# Patient Record
Sex: Male | Born: 1970 | Race: White | Hispanic: No | Marital: Married | Smoking: Never smoker
Health system: Southern US, Community
[De-identification: ages and names within clinical notes are randomized; demographics above are authoritative.]

## PROBLEM LIST (undated history)

## (undated) DIAGNOSIS — K219 Gastro-esophageal reflux disease without esophagitis: Secondary | ICD-10-CM

## (undated) DIAGNOSIS — M545 Low back pain, unspecified: Secondary | ICD-10-CM

## (undated) HISTORY — DX: Gastro-esophageal reflux disease without esophagitis: K21.9

## (undated) HISTORY — DX: Low back pain, unspecified: M54.50

---

## 2006-02-20 ENCOUNTER — Emergency Department (HOSPITAL_COMMUNITY): Admission: EM | Admit: 2006-02-20 | Discharge: 2006-02-20 | Payer: Self-pay | Admitting: Emergency Medicine

## 2007-08-13 IMAGING — CR DG NASAL BONES 3+V
3 series · 3 of 3 positions shown · non-contrast
Comparison: none

CLINICAL DATA: Blunt trauma.
NASAL BONES - 3 VIEW:

[w waters]
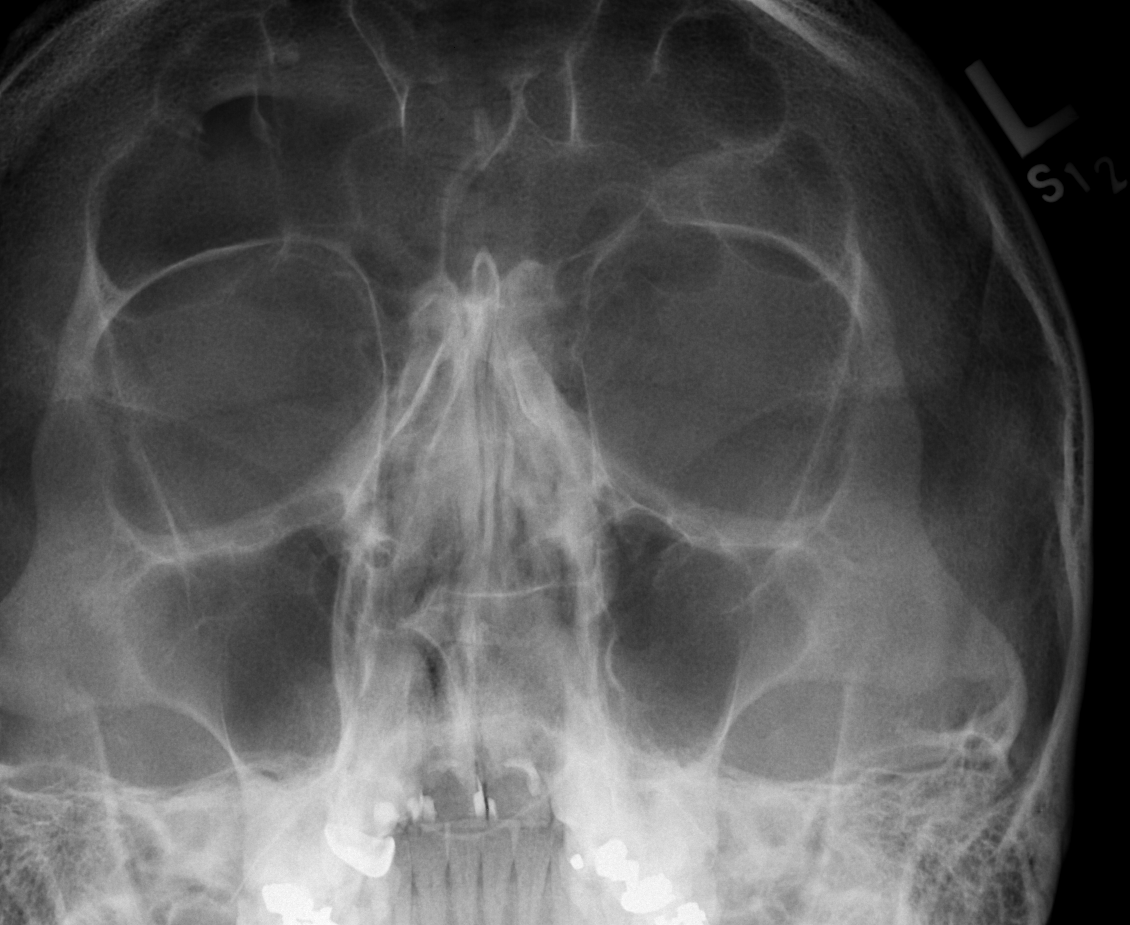

[w skull lat * (1 of 2)]
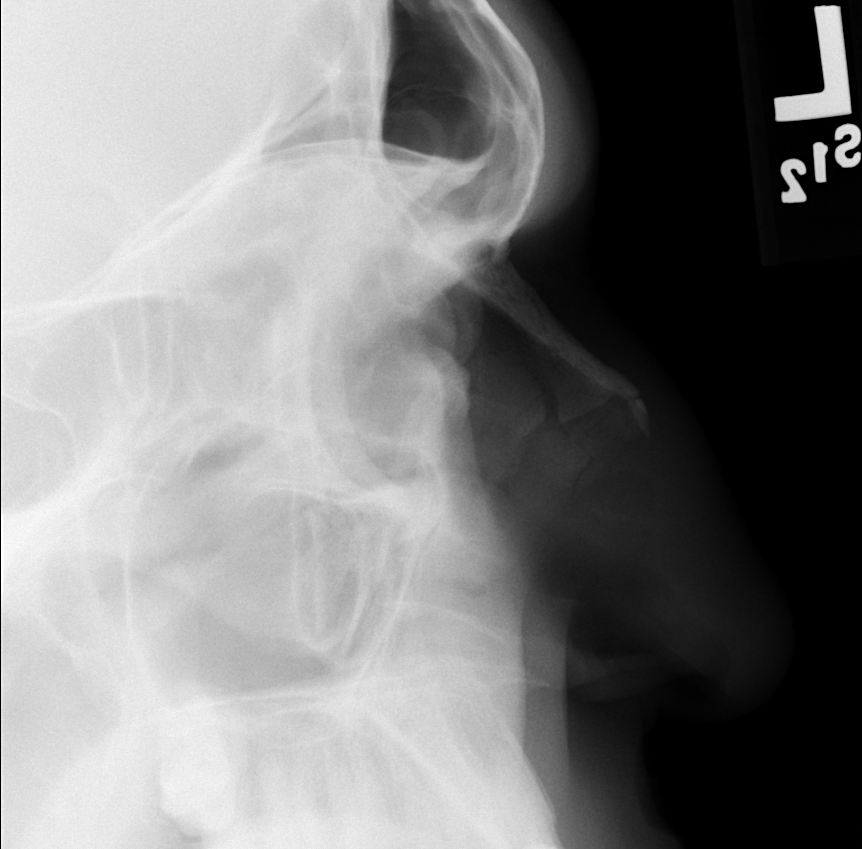

[w skull lat * (2 of 2)]
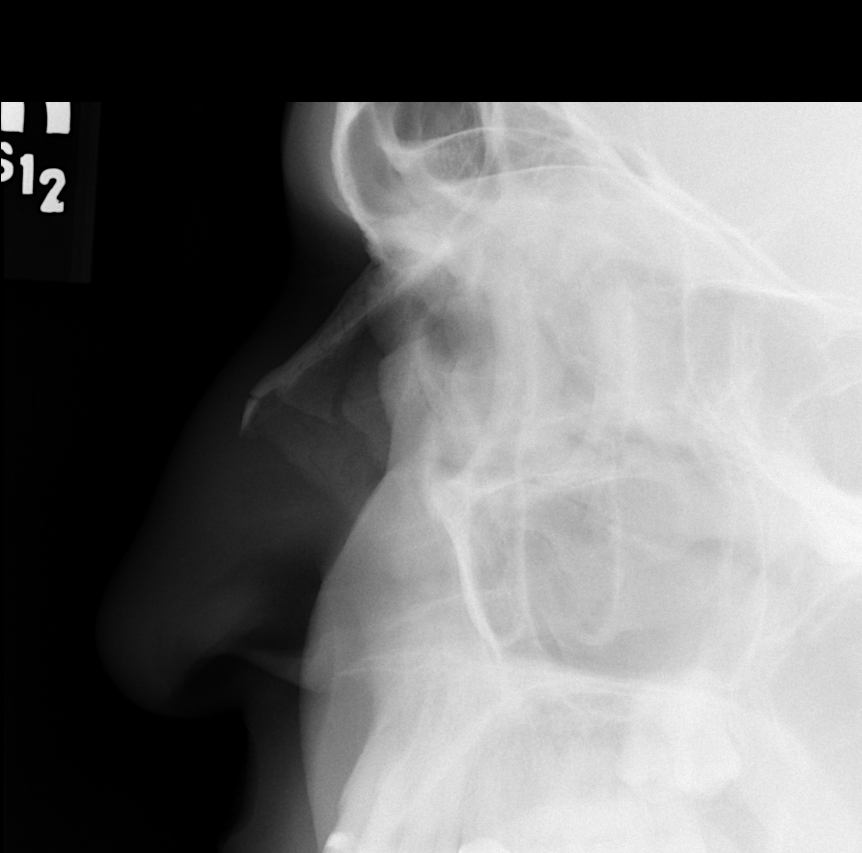

[3 of 3 positions shown; findings below may reference images not displayed]

FINDINGS: Fracture of the nasal bones with minimal displacement.
IMPRESSION: Bilateral nasal bone fracture.

## 2015-06-07 DIAGNOSIS — M65832 Other synovitis and tenosynovitis, left forearm: Secondary | ICD-10-CM | POA: Insufficient documentation

## 2018-01-11 DIAGNOSIS — M25512 Pain in left shoulder: Secondary | ICD-10-CM | POA: Diagnosis not present

## 2018-01-11 DIAGNOSIS — Z87891 Personal history of nicotine dependence: Secondary | ICD-10-CM | POA: Diagnosis not present

## 2018-01-11 DIAGNOSIS — Z6834 Body mass index (BMI) 34.0-34.9, adult: Secondary | ICD-10-CM | POA: Diagnosis not present

## 2018-01-11 DIAGNOSIS — S42022A Displaced fracture of shaft of left clavicle, initial encounter for closed fracture: Secondary | ICD-10-CM | POA: Diagnosis not present

## 2018-01-14 DIAGNOSIS — Z6834 Body mass index (BMI) 34.0-34.9, adult: Secondary | ICD-10-CM | POA: Diagnosis not present

## 2018-01-14 DIAGNOSIS — S42022A Displaced fracture of shaft of left clavicle, initial encounter for closed fracture: Secondary | ICD-10-CM | POA: Diagnosis not present

## 2018-01-14 HISTORY — DX: Displaced fracture of shaft of left clavicle, initial encounter for closed fracture: S42.022A

## 2018-01-15 DIAGNOSIS — Z8042 Family history of malignant neoplasm of prostate: Secondary | ICD-10-CM | POA: Diagnosis not present

## 2018-01-15 DIAGNOSIS — Z125 Encounter for screening for malignant neoplasm of prostate: Secondary | ICD-10-CM | POA: Diagnosis not present

## 2018-01-15 DIAGNOSIS — R5383 Other fatigue: Secondary | ICD-10-CM | POA: Diagnosis not present

## 2018-01-15 DIAGNOSIS — S42002G Fracture of unspecified part of left clavicle, subsequent encounter for fracture with delayed healing: Secondary | ICD-10-CM | POA: Diagnosis not present

## 2018-01-15 DIAGNOSIS — E782 Mixed hyperlipidemia: Secondary | ICD-10-CM | POA: Diagnosis not present

## 2018-01-15 DIAGNOSIS — Z01810 Encounter for preprocedural cardiovascular examination: Secondary | ICD-10-CM | POA: Diagnosis not present

## 2018-01-15 DIAGNOSIS — Z83438 Family history of other disorder of lipoprotein metabolism and other lipidemia: Secondary | ICD-10-CM | POA: Diagnosis not present

## 2018-01-22 DIAGNOSIS — S42022A Displaced fracture of shaft of left clavicle, initial encounter for closed fracture: Secondary | ICD-10-CM | POA: Diagnosis not present

## 2018-01-25 DIAGNOSIS — S42022A Displaced fracture of shaft of left clavicle, initial encounter for closed fracture: Secondary | ICD-10-CM | POA: Diagnosis not present

## 2018-01-30 DIAGNOSIS — S42022A Displaced fracture of shaft of left clavicle, initial encounter for closed fracture: Secondary | ICD-10-CM | POA: Diagnosis not present

## 2018-02-01 DIAGNOSIS — S42022A Displaced fracture of shaft of left clavicle, initial encounter for closed fracture: Secondary | ICD-10-CM | POA: Diagnosis not present

## 2018-02-04 DIAGNOSIS — S42022D Displaced fracture of shaft of left clavicle, subsequent encounter for fracture with routine healing: Secondary | ICD-10-CM | POA: Diagnosis not present

## 2018-02-04 DIAGNOSIS — S42022A Displaced fracture of shaft of left clavicle, initial encounter for closed fracture: Secondary | ICD-10-CM | POA: Diagnosis not present

## 2018-02-06 DIAGNOSIS — S42022A Displaced fracture of shaft of left clavicle, initial encounter for closed fracture: Secondary | ICD-10-CM | POA: Diagnosis not present

## 2018-02-08 DIAGNOSIS — S42022A Displaced fracture of shaft of left clavicle, initial encounter for closed fracture: Secondary | ICD-10-CM | POA: Diagnosis not present

## 2018-02-13 DIAGNOSIS — S42022A Displaced fracture of shaft of left clavicle, initial encounter for closed fracture: Secondary | ICD-10-CM | POA: Diagnosis not present

## 2018-02-20 DIAGNOSIS — S42022A Displaced fracture of shaft of left clavicle, initial encounter for closed fracture: Secondary | ICD-10-CM | POA: Diagnosis not present

## 2018-02-27 DIAGNOSIS — S42022A Displaced fracture of shaft of left clavicle, initial encounter for closed fracture: Secondary | ICD-10-CM | POA: Diagnosis not present

## 2018-03-04 DIAGNOSIS — S42022D Displaced fracture of shaft of left clavicle, subsequent encounter for fracture with routine healing: Secondary | ICD-10-CM | POA: Diagnosis not present

## 2018-03-04 DIAGNOSIS — S42022A Displaced fracture of shaft of left clavicle, initial encounter for closed fracture: Secondary | ICD-10-CM | POA: Diagnosis not present

## 2018-03-06 DIAGNOSIS — S42022A Displaced fracture of shaft of left clavicle, initial encounter for closed fracture: Secondary | ICD-10-CM | POA: Diagnosis not present

## 2018-04-29 DIAGNOSIS — S42022D Displaced fracture of shaft of left clavicle, subsequent encounter for fracture with routine healing: Secondary | ICD-10-CM | POA: Diagnosis not present

## 2018-04-29 DIAGNOSIS — Z6834 Body mass index (BMI) 34.0-34.9, adult: Secondary | ICD-10-CM | POA: Diagnosis not present

## 2020-02-05 DIAGNOSIS — Q5529 Other congenital malformations of testis and scrotum: Secondary | ICD-10-CM | POA: Diagnosis not present

## 2020-02-05 DIAGNOSIS — N50811 Right testicular pain: Secondary | ICD-10-CM | POA: Diagnosis not present

## 2020-02-05 DIAGNOSIS — N431 Infected hydrocele: Secondary | ICD-10-CM | POA: Diagnosis not present

## 2020-02-05 DIAGNOSIS — N451 Epididymitis: Secondary | ICD-10-CM | POA: Diagnosis not present

## 2020-02-05 DIAGNOSIS — N433 Hydrocele, unspecified: Secondary | ICD-10-CM | POA: Diagnosis not present

## 2020-02-05 DIAGNOSIS — R000 Tachycardia, unspecified: Secondary | ICD-10-CM | POA: Diagnosis not present

## 2020-02-05 DIAGNOSIS — D2932 Benign neoplasm of left epididymis: Secondary | ICD-10-CM | POA: Diagnosis not present

## 2020-02-05 DIAGNOSIS — R402412 Glasgow coma scale score 13-15, at arrival to emergency department: Secondary | ICD-10-CM | POA: Diagnosis not present

## 2020-02-05 HISTORY — DX: Epididymitis: N45.1

## 2020-02-05 HISTORY — PX: OTHER SURGICAL HISTORY: SHX169

## 2020-02-06 DIAGNOSIS — N451 Epididymitis: Secondary | ICD-10-CM | POA: Insufficient documentation

## 2020-02-06 DIAGNOSIS — K219 Gastro-esophageal reflux disease without esophagitis: Secondary | ICD-10-CM | POA: Insufficient documentation

## 2020-02-11 DIAGNOSIS — N451 Epididymitis: Secondary | ICD-10-CM | POA: Diagnosis not present

## 2020-02-24 ENCOUNTER — Ambulatory Visit (INDEPENDENT_AMBULATORY_CARE_PROVIDER_SITE_OTHER): Payer: BC Managed Care – PPO | Admitting: Legal Medicine

## 2020-02-24 ENCOUNTER — Encounter: Payer: Self-pay | Admitting: Legal Medicine

## 2020-02-24 ENCOUNTER — Other Ambulatory Visit: Payer: Self-pay

## 2020-02-24 VITALS — BP 120/80 | HR 80 | Temp 98.2°F | Resp 16 | Ht 73.0 in | Wt 266.0 lb

## 2020-02-24 DIAGNOSIS — T8189XA Other complications of procedures, not elsewhere classified, initial encounter: Secondary | ICD-10-CM | POA: Diagnosis not present

## 2020-02-24 DIAGNOSIS — N492 Inflammatory disorders of scrotum: Secondary | ICD-10-CM

## 2020-02-24 NOTE — Progress Notes (Signed)
Established Patient Office Visit  Subjective:  Patient ID: Jacob Stephens, male    DOB: Sep 11, 1970  Age: 49 y.o. MRN: [id number]  CC:  Chief Complaint  Patient presents with  . testicle surgery    Surgery on 02/05/2020 at [place]    HPI Jacob Stephens presents for patient has scrotal infection on 02/05/2020.  He did well, surgeon I & D and called in cipro for him.  He has an opening in scotum. He was first on bactrim. They have been trying to get surgeon to see the wound that is not healing. He now has cipro for 10 days. He continues to have drainage, with some fascia in wound. I have cultured.  Past Medical History:  Diagnosis Date  . Closed displaced fracture of shaft of left clavicle 01/14/2018   Formatting of this note might be different from the original. Added automatically from request for surgery [id number]  . Epididymitis, right 02/05/2020  . GERD (gastroesophageal reflux disease)   . Low back pain     Past Surgical History:  Procedure Laterality Date  . S/P surgical exploration of scrotum Right 02/05/2020    Family History  Problem Relation Age of Onset  . Diverticulitis Mother   . Thyroid disease Mother   . Prostate cancer Father   . Heart attack Father     Social History   Socioeconomic History  . Marital status: Married    Spouse name: Not on file  . Number of children: 2  . Years of education: Not on file  . Highest education level: Not on file  Occupational History  . Occupation: Truck Geophysicist/field seismologist  Tobacco Use  . Smoking status: Never Smoker  . Smokeless tobacco: Never Used  Substance and Sexual Activity  . Alcohol use: Never  . Drug use: Never  . Sexual activity: Not Currently  Other Topics Concern  . Not on file  Social History Narrative  . Not on file   Social Determinants of Health   Financial Resource Strain:   . Difficulty of Paying Living Expenses: Not on file  Food Insecurity:   . Worried About Charity fundraiser in the Last Year: Not on  file  . Ran Out of Food in the Last Year: Not on file  Transportation Needs:   . Lack of Transportation (Medical): Not on file  . Lack of Transportation (Non-Medical): Not on file  Physical Activity:   . Days of Exercise per Week: Not on file  . Minutes of Exercise per Session: Not on file  Stress:   . Feeling of Stress : Not on file  Social Connections:   . Frequency of Communication with Friends and Family: Not on file  . Frequency of Social Gatherings with Friends and Family: Not on file  . Attends Religious Services: Not on file  . Active Member of Clubs or Organizations: Not on file  . Attends Archivist Meetings: Not on file  . Marital Status: Not on file  Intimate Partner Violence:   . Fear of Current or Ex-Partner: Not on file  . Emotionally Abused: Not on file  . Physically Abused: Not on file  . Sexually Abused: Not on file    Outpatient Medications Prior to Visit  Medication Sig Dispense Refill  . ciprofloxacin (CIPRO) 500 MG tablet Take 500 mg by mouth 2 (two) times daily. (Patient not taking: Reported on 02/24/2020)     No facility-administered medications prior to visit.    Allergies  Allergen  Reactions  . Tramadol Nausea Only    ROS Review of Systems  Constitutional: Negative.   HENT: Negative.   Eyes: Negative.   Respiratory: Negative.   Cardiovascular: Negative.   Gastrointestinal: Negative.   Endocrine: Negative.   Genitourinary:       Drainage from scrotum on right  Musculoskeletal: Negative.   Skin: Negative.   Neurological: Negative.       Objective:    Physical Exam Vitals reviewed.  Constitutional:      Appearance: Normal appearance.  HENT:     Right Ear: Tympanic membrane normal.     Left Ear: Tympanic membrane normal.  Cardiovascular:     Rate and Rhythm: Normal rate and regular rhythm.     Pulses: Normal pulses.  Pulmonary:     Effort: Pulmonary effort is normal.     Breath sounds: Normal breath sounds.    Genitourinary:    Rectum: Guaiac result negative.     Comments: Open wound right scrotum 1cm- steristripped shut Musculoskeletal:        General: Normal range of motion.     Cervical back: Normal range of motion and neck supple.  Skin:    General: Skin is warm.     Capillary Refill: Capillary refill takes less than 2 seconds.  Neurological:     Mental Status: He is alert.     BP 120/80   Pulse 80   Temp 98.2 F (36.8 C)   Resp 16   Ht '6\' 1"'  (1.854 m)   Wt 266 lb (120.7 kg)   BMI 35.09 kg/m  Wt Readings from Last 3 Encounters:  02/24/20 266 lb (120.7 kg)     Health Maintenance Due  Topic Date Due  . Hepatitis C Screening  Never done  . HIV Screening  Never done  . TETANUS/TDAP  Never done    There are no preventive care reminders to display for this patient.  No results found for: TSH No results found for: WBC, HGB, HCT, MCV, PLT No results found for: NA, K, CHLORIDE, CO2, GLUCOSE, BUN, CREATININE, BILITOT, ALKPHOS, AST, ALT, PROT, ALBUMIN, CALCIUM, ANIONGAP, EGFR, GFR No results found for: CHOL No results found for: HDL No results found for: LDLCALC No results found for: TRIG No results found for: CHOLHDL No results found for: HGBA1C    Assessment & Plan:   Problem List Items Addressed This Visit      Other   Nonhealing surgical wound The area the drain was in is still wide open 1cm, he is having purulent discharge  And cipro started after cultured.  The wound was cleaned and steristripped closed .  He is to see surgeon or me in one week.  They are going out of town.    Other Visit Diagnoses    Recurrent scrotal infection    -  Primary   Relevant Orders   WOUND CULTURE Patient is being treated with ciprofloxacin, culture pendng         Follow-up: Return in about 1 week (around 03/02/2020).    Reinaldo Meeker, MD

## 2020-02-27 ENCOUNTER — Encounter: Payer: Self-pay | Admitting: Legal Medicine

## 2020-02-27 ENCOUNTER — Other Ambulatory Visit: Payer: Self-pay

## 2020-02-27 ENCOUNTER — Ambulatory Visit (INDEPENDENT_AMBULATORY_CARE_PROVIDER_SITE_OTHER): Payer: BC Managed Care – PPO | Admitting: Legal Medicine

## 2020-02-27 DIAGNOSIS — S3130XD Unspecified open wound of scrotum and testes, subsequent encounter: Secondary | ICD-10-CM | POA: Diagnosis not present

## 2020-02-27 DIAGNOSIS — S3130XA Unspecified open wound of scrotum and testes, initial encounter: Secondary | ICD-10-CM | POA: Insufficient documentation

## 2020-02-27 NOTE — Progress Notes (Signed)
Established Patient Office Visit  Subjective:  Patient ID: Jacob Stephens, male    DOB: 13-Jun-1970  Age: 49 y.o. MRN: [id number]  CC:  Chief Complaint  Patient presents with  . Wound Check    HPI Refujio Haymer presents for dehiscence of right scrotal incision.  He is here for repair since steri strips wound no hold. No infection.  Past Medical History:  Diagnosis Date  . Closed displaced fracture of shaft of left clavicle 01/14/2018   Formatting of this note might be different from the original. Added automatically from request for surgery [id number]  . Epididymitis, right 02/05/2020  . GERD (gastroesophageal reflux disease)   . Low back pain     Past Surgical History:  Procedure Laterality Date  . S/P surgical exploration of scrotum Right 02/05/2020    Family History  Problem Relation Age of Onset  . Diverticulitis Mother   . Thyroid disease Mother   . Prostate cancer Father   . Heart attack Father     Social History   Socioeconomic History  . Marital status: Married    Spouse name: Not on file  . Number of children: 2  . Years of education: Not on file  . Highest education level: Not on file  Occupational History  . Occupation: Truck Geophysicist/field seismologist  Tobacco Use  . Smoking status: Never Smoker  . Smokeless tobacco: Never Used  Substance and Sexual Activity  . Alcohol use: Never  . Drug use: Never  . Sexual activity: Not Currently  Other Topics Concern  . Not on file  Social History Narrative  . Not on file   Social Determinants of Health   Financial Resource Strain:   . Difficulty of Paying Living Expenses: Not on file  Food Insecurity:   . Worried About Charity fundraiser in the Last Year: Not on file  . Ran Out of Food in the Last Year: Not on file  Transportation Needs:   . Lack of Transportation (Medical): Not on file  . Lack of Transportation (Non-Medical): Not on file  Physical Activity:   . Days of Exercise per Week: Not on file  . Minutes of Exercise  per Session: Not on file  Stress:   . Feeling of Stress : Not on file  Social Connections:   . Frequency of Communication with Friends and Family: Not on file  . Frequency of Social Gatherings with Friends and Family: Not on file  . Attends Religious Services: Not on file  . Active Member of Clubs or Organizations: Not on file  . Attends Archivist Meetings: Not on file  . Marital Status: Not on file  Intimate Partner Violence:   . Fear of Current or Ex-Partner: Not on file  . Emotionally Abused: Not on file  . Physically Abused: Not on file  . Sexually Abused: Not on file    Outpatient Medications Prior to Visit  Medication Sig Dispense Refill  . esomeprazole (NEXIUM) 20 MG capsule Take by mouth.    . ciprofloxacin (CIPRO) 500 MG tablet Take 500 mg by mouth 2 (two) times daily. (Patient not taking: Reported on 02/24/2020)     No facility-administered medications prior to visit.    Allergies  Allergen Reactions  . Tramadol Nausea Only    ROS Review of Systems  Constitutional: Negative.   HENT: Negative.   Respiratory: Negative.   Cardiovascular: Negative.   Genitourinary: Negative.   Musculoskeletal: Negative.   Skin: Positive for wound (right scrotum).  Neurological: Negative.   Psychiatric/Behavioral: Negative.       Objective:    Physical Exam Vitals reviewed.  Constitutional:      Appearance: Normal appearance.  Cardiovascular:     Rate and Rhythm: Normal rate and regular rhythm.     Pulses: Normal pulses.     Heart sounds: Normal heart sounds.  Pulmonary:     Effort: Pulmonary effort is normal.     Breath sounds: Normal breath sounds.  Skin:    Comments: 1cm open wound right scrotum, no purulence.  The surgical incision by urology dehisced and patient was unable to get hold of the surgeon  Neurological:     Mental Status: He is alert.     BP 120/72 (BP Location: Left Arm, Patient Position: Sitting, Cuff Size: Normal)   Pulse 75   Temp  97.9 F (36.6 C) (Temporal)   Ht _0  (1.854 m)   Wt 266 lb 6.4 oz (120.8 kg)   SpO2 100%   BMI 35.15 kg/m  Wt Readings from Last 3 Encounters:  02/27/20 266 lb 6.4 oz (120.8 kg)  02/24/20 266 lb (120.7 kg)     Health Maintenance Due  Topic Date Due  . Hepatitis C Screening  Never done  . HIV Screening  Never done  . TETANUS/TDAP  Never done    There are no preventive care reminders to display for this patient.  No results found for: TSH No results found for: WBC, HGB, HCT, MCV, PLT No results found for: NA, K, CHLORIDE, CO2, GLUCOSE, BUN, CREATININE, BILITOT, ALKPHOS, AST, ALT, PROT, ALBUMIN, CALCIUM, ANIONGAP, EGFR, GFR No results found for: CHOL No results found for: HDL No results found for: LDLCALC No results found for: TRIG No results found for: CHOLHDL No results found for: HGBA1C    Assessment & Plan:   Problem List Items Addressed This Visit      Genitourinary   Open wound of scrotum    The wound was sutured closed.  Procedure: the scrotum was cleaned with betadine and prepped and draped .  1% xylocaine and epinephrine installed 3cc.  #3 0000 sutures installed.  No complications.  Follow up one week.  Routine wound care instructions    Follow-up: Return in about 1 week (around 03/05/2020) for suture removal.    Reinaldo Meeker, MD

## 2020-02-29 NOTE — Patient Instructions (Signed)
Wound Care, Adult Taking care of your wound properly can help to prevent pain, infection, and scarring. It can also help your wound to heal more quickly. How to care for your wound Wound care      Follow instructions from your health care provider about how to take care of your wound. Make sure you: ? Wash your hands with soap and water before you change the bandage (dressing). If soap and water are not available, use hand sanitizer. ? Change your dressing as told by your health care provider. ? Leave stitches (sutures), skin glue, or adhesive strips in place. These skin closures may need to stay in place for 2 weeks or longer. If adhesive strip edges start to loosen and curl up, you may trim the loose edges. Do not remove adhesive strips completely unless your health care provider tells you to do that.  Check your wound area every day for signs of infection. Check for: ? Redness, swelling, or pain. ? Fluid or blood. ? Warmth. ? Pus or a bad smell.  Ask your health care provider if you should clean the wound with mild soap and water. Doing this may include: ? Using a clean towel to pat the wound dry after cleaning it. Do not rub or scrub the wound. ? Applying a cream or ointment. Do this only as told by your health care provider. ? Covering the incision with a clean dressing.  Ask your health care provider when you can leave the wound uncovered.  Keep the dressing dry until your health care provider says it can be removed. Do not take baths, swim, use a hot tub, or do anything that would put the wound underwater until your health care provider approves. Ask your health care provider if you can take showers. You may only be allowed to take sponge baths. Medicines   If you were prescribed an antibiotic medicine, cream, or ointment, take or use the antibiotic as told by your health care provider. Do not stop taking or using the antibiotic even if your condition improves.  Take  over-the-counter and prescription medicines only as told by your health care provider. If you were prescribed pain medicine, take it 30 or more minutes before you do any wound care or as told by your health care provider. General instructions  Return to your normal activities as told by your health care provider. Ask your health care provider what activities are safe.  Do not scratch or pick at the wound.  Do not use any products that contain nicotine or tobacco, such as cigarettes and e-cigarettes. These may delay wound healing. If you need help quitting, ask your health care provider.  Keep all follow-up visits as told by your health care provider. This is important.  Eat a diet that includes protein, vitamin A, vitamin C, and other nutrient-rich foods to help the wound heal. ? Foods rich in protein include meat, dairy, beans, nuts, and other sources. ? Foods rich in vitamin A include carrots and dark green, leafy vegetables. ? Foods rich in vitamin C include citrus, tomatoes, and other fruits and vegetables. ? Nutrient-rich foods have protein, carbohydrates, fat, vitamins, or minerals. Eat a variety of healthy foods including vegetables, fruits, and whole grains. Contact a health care provider if:  You received a tetanus shot and you have swelling, severe pain, redness, or bleeding at the injection site.  Your pain is not controlled with medicine.  You have redness, swelling, or pain around the wound.    You have fluid or blood coming from the wound.  Your wound feels warm to the touch.  You have pus or a bad smell coming from the wound.  You have a fever or chills.  You are nauseous or you vomit.  You are dizzy. Get help right away if:  You have a red streak going away from your wound.  The edges of the wound open up and separate.  Your wound is bleeding, and the bleeding does not stop with gentle pressure.  You have a rash.  You faint.  You have trouble  breathing. Summary  Always wash your hands with soap and water before changing your bandage (dressing).  To help with healing, eat foods that are rich in protein, vitamin A, vitamin C, and other nutrients.  Check your wound every day for signs of infection. Contact your health care provider if you suspect that your wound is infected. This information is not intended to replace advice given to you by your health care provider. Make sure you discuss any questions you have with your health care provider. Document Revised: 08/05/2018 Document Reviewed: 11/02/2015 Elsevier Patient Education  2020 [employer]  

## 2020-03-01 ENCOUNTER — Ambulatory Visit: Payer: BC Managed Care – PPO | Admitting: Legal Medicine

## 2020-03-01 LAB — WOUND CULTURE

## 2020-03-01 NOTE — Progress Notes (Signed)
Culture- E. Coli scrotum sensitive to cipro lp

## 2020-03-05 ENCOUNTER — Ambulatory Visit: Payer: BC Managed Care – PPO | Admitting: Legal Medicine

## 2020-03-08 ENCOUNTER — Ambulatory Visit (INDEPENDENT_AMBULATORY_CARE_PROVIDER_SITE_OTHER): Payer: BC Managed Care – PPO | Admitting: Legal Medicine

## 2020-03-08 ENCOUNTER — Encounter: Payer: Self-pay | Admitting: Legal Medicine

## 2020-03-08 ENCOUNTER — Other Ambulatory Visit: Payer: Self-pay

## 2020-03-08 VITALS — BP 110/70 | HR 77 | Temp 98.0°F | Resp 16 | Ht 73.0 in | Wt 272.0 lb

## 2020-03-08 DIAGNOSIS — S3130XD Unspecified open wound of scrotum and testes, subsequent encounter: Secondary | ICD-10-CM | POA: Diagnosis not present

## 2020-03-08 NOTE — Progress Notes (Signed)
Subjective:  Patient ID: Jacob Stephens, male    DOB: 12/28/1970  Age: 49 y.o. MRN: [id number]  Chief Complaint  Patient presents with  . Wound Check    stitches removed    HPI: the scrotum is still red and draining. , the sutures are intact.  He is still having some drainage from the open wound.  He is off his antibiotics.  Sutures remain.  Recultured the drainage since he is continued to have some infection and this open wound will not heal until infection has resolved.  Patient and his wife do understand this.   Current Outpatient Medications on File Prior to Visit  Medication Sig Dispense Refill  . esomeprazole (NEXIUM) 20 MG capsule Take by mouth.     No current facility-administered medications on file prior to visit.   Past Medical History:  Diagnosis Date  . Closed displaced fracture of shaft of left clavicle 01/14/2018   Formatting of this note might be different from the original. Added automatically from request for surgery [id number]  . Epididymitis, right 02/05/2020  . GERD (gastroesophageal reflux disease)   . Low back pain    Past Surgical History:  Procedure Laterality Date  . S/P surgical exploration of scrotum Right 02/05/2020    Family History  Problem Relation Age of Onset  . Diverticulitis Mother   . Thyroid disease Mother   . Prostate cancer Father   . Heart attack Father    Social History   Socioeconomic History  . Marital status: Married    Spouse name: Not on file  . Number of children: 2  . Years of education: Not on file  . Highest education level: Not on file  Occupational History  . Occupation: Truck Hospital doctor  Tobacco Use  . Smoking status: Never Smoker  . Smokeless tobacco: Never Used  Substance and Sexual Activity  . Alcohol use: Never  . Drug use: Never  . Sexual activity: Not Currently  Other Topics Concern  . Not on file  Social History Narrative  . Not on file   Social Determinants of Health   Financial Resource Strain:   .  Difficulty of Paying Living Expenses: Not on file  Food Insecurity:   . Worried About Programme researcher, broadcasting/film/video in the Last Year: Not on file  . Ran Out of Food in the Last Year: Not on file  Transportation Needs:   . Lack of Transportation (Medical): Not on file  . Lack of Transportation (Non-Medical): Not on file  Physical Activity:   . Days of Exercise per Week: Not on file  . Minutes of Exercise per Session: Not on file  Stress:   . Feeling of Stress : Not on file  Social Connections:   . Frequency of Communication with Friends and Family: Not on file  . Frequency of Social Gatherings with Friends and Family: Not on file  . Attends Religious Services: Not on file  . Active Member of Clubs or Organizations: Not on file  . Attends Banker Meetings: Not on file  . Marital Status: Not on file    Review of Systems  Constitutional: Negative.   HENT: Negative.   Eyes: Negative.   Respiratory: Negative.   Cardiovascular: Negative.   Gastrointestinal: Negative.   Genitourinary: Positive for scrotal swelling.  Musculoskeletal: Negative.   Neurological: Negative.   Psychiatric/Behavioral: Negative.      Objective:  BP 110/70   Pulse 77   Temp 98 F (36.7 C)  Resp 16   Ht 6\' 1"  (1.854 m)   Wt 272 lb (123.4 kg)   SpO2 99%   BMI 35.89 kg/m   BP/Weight 03/08/2020 02/27/2020 02/24/2020  Systolic BP 110 120 120  Diastolic BP 70 72 80  Wt. (Lbs) 272 266.4 266  BMI 35.89 35.15 35.09    Physical Exam Vitals reviewed.  Constitutional:      Appearance: Normal appearance.  HENT:     Head: Normocephalic and atraumatic.     Right Ear: Tympanic membrane normal.     Left Ear: Tympanic membrane normal.  Cardiovascular:     Rate and Rhythm: Normal rate and regular rhythm.     Pulses: Normal pulses.     Heart sounds: Normal heart sounds.  Pulmonary:     Effort: Pulmonary effort is normal.     Breath sounds: Normal breath sounds.  Genitourinary:    Comments: Scrotum  still draining but sutures intact.  Re-culture drainage before new antibiotics. Neurological:     Mental Status: He is alert.       No results found for: WBC, HGB, HCT, PLT, GLUCOSE, CHOL, TRIG, HDL, LDLDIRECT, LDLCALC, ALT, AST, NA, K, CL, CREATININE, BUN, CO2, TSH, PSA, INR, GLUF, HGBA1C, MICROALBUR    Assessment & Plan:   1. Open wound of scrotum, subsequent encounter - Anaerobic and Aerobic Culture Patient is having some improvement of the scrotal open wound but it continues to drain and is somewhat red.  It is nontender.  Area was cultured and sent off before we start any new antibiotics.  We discussed the need to use you but was sterilized the area for the open wound to actually heal.  We will follow back in 2 weeks otherwise.       Follow-up: Return in about 2 weeks (around 03/22/2020) for infection.  An After Visit Summary was printed and given to the patient.  03/24/2020 [facility] [phone]

## 2020-03-12 ENCOUNTER — Other Ambulatory Visit: Payer: Self-pay

## 2020-03-12 LAB — ANAEROBIC AND AEROBIC CULTURE

## 2020-03-12 MED ORDER — CEPHALEXIN 500 MG PO CAPS: 500.0000 mg | ORAL_CAPSULE | Freq: Two times a day (BID) | ORAL | 0 refills | Status: DC

## 2020-03-14 NOTE — Progress Notes (Signed)
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 lp

## 2020-03-19 ENCOUNTER — Other Ambulatory Visit: Payer: Self-pay

## 2020-03-19 ENCOUNTER — Ambulatory Visit (INDEPENDENT_AMBULATORY_CARE_PROVIDER_SITE_OTHER): Payer: BC Managed Care – PPO | Admitting: Legal Medicine

## 2020-03-19 ENCOUNTER — Encounter: Payer: Self-pay | Admitting: Legal Medicine

## 2020-03-19 VITALS — BP 120/80 | HR 77 | Temp 97.7°F | Resp 16 | Ht 73.0 in | Wt 266.0 lb

## 2020-03-19 DIAGNOSIS — S3130XD Unspecified open wound of scrotum and testes, subsequent encounter: Secondary | ICD-10-CM

## 2020-03-19 NOTE — Progress Notes (Signed)
Acute Office Visit  Subjective:    Patient ID: Jacob Stephens, male    DOB: March 02, 1971, 49 y.o.   MRN: [id number]  Chief Complaint  Patient presents with  . Open Wound    HPI Patient is in today for follow up of scrotum open wound.  He was sutured and now the scrotal incision is well healed.  He is still getting some purulent drainage from drainage site.  He continues on antibiotics, we will see in one month.  He was followed up 03/19/2020 for suture removal.  The wound has closed well  Past Medical History:  Diagnosis Date  . Closed displaced fracture of shaft of left clavicle 01/14/2018   Formatting of this note might be different from the original. Added automatically from request for surgery [id number]  . Epididymitis, right 02/05/2020  . GERD (gastroesophageal reflux disease)   . Low back pain     Past Surgical History:  Procedure Laterality Date  . S/P surgical exploration of scrotum Right 02/05/2020    Family History  Problem Relation Age of Onset  . Diverticulitis Mother   . Thyroid disease Mother   . Prostate cancer Father   . Heart attack Father     Social History   Socioeconomic History  . Marital status: Married    Spouse name: Not on file  . Number of children: 2  . Years of education: Not on file  . Highest education level: Not on file  Occupational History  . Occupation: Truck Geophysicist/field seismologist  Tobacco Use  . Smoking status: Never Smoker  . Smokeless tobacco: Never Used  Substance and Sexual Activity  . Alcohol use: Never  . Drug use: Never  . Sexual activity: Not Currently  Other Topics Concern  . Not on file  Social History Narrative  . Not on file   Social Determinants of Health   Financial Resource Strain:   . Difficulty of Paying Living Expenses: Not on file  Food Insecurity:   . Worried About Charity fundraiser in the Last Year: Not on file  . Ran Out of Food in the Last Year: Not on file  Transportation Needs:   . Lack of Transportation  (Medical): Not on file  . Lack of Transportation (Non-Medical): Not on file  Physical Activity:   . Days of Exercise per Week: Not on file  . Minutes of Exercise per Session: Not on file  Stress:   . Feeling of Stress : Not on file  Social Connections:   . Frequency of Communication with Friends and Family: Not on file  . Frequency of Social Gatherings with Friends and Family: Not on file  . Attends Religious Services: Not on file  . Active Member of Clubs or Organizations: Not on file  . Attends Archivist Meetings: Not on file  . Marital Status: Not on file  Intimate Partner Violence:   . Fear of Current or Ex-Partner: Not on file  . Emotionally Abused: Not on file  . Physically Abused: Not on file  . Sexually Abused: Not on file    Outpatient Medications Prior to Visit  Medication Sig Dispense Refill  . cephALEXin (KEFLEX) 500 MG capsule Take 1 capsule (500 mg total) by mouth in the morning and at bedtime. 20 capsule 0  . esomeprazole (NEXIUM) 20 MG capsule Take by mouth.     No facility-administered medications prior to visit.    Allergies  Allergen Reactions  . Tramadol Nausea Only  Review of Systems  Constitutional: Negative for activity change and appetite change.  HENT: Negative.  Negative for congestion.   Eyes: Negative.   Respiratory: Negative for apnea, cough, choking and chest tightness.   Cardiovascular: Positive for leg swelling. Negative for chest pain.  Endocrine: Negative.   Genitourinary: Negative for difficulty urinating and dysuria.  Skin:       incison site well healed  Neurological: Negative.   Psychiatric/Behavioral: Negative.        Objective:    Physical Exam Vitals reviewed.  Constitutional:      Appearance: Normal appearance.  Cardiovascular:     Rate and Rhythm: Normal rate and regular rhythm.     Pulses: Normal pulses.     Heart sounds: Normal heart sounds.  Pulmonary:     Effort: Pulmonary effort is normal.   Skin:    Comments: incision site on right side of scrotum, sutures removed. Well healed.  There is some drainage from the drain tube site. He will remain on antibiotics.  Neurological:     General: No focal deficit present.     Mental Status: He is alert.     BP 120/80   Pulse 77   Temp 97.7 F (36.5 C)   Resp 16   Ht _0  (1.854 m)   Wt 266 lb (120.7 kg)   SpO2 96%   BMI 35.09 kg/m  Wt Readings from Last 3 Encounters:  03/19/20 266 lb (120.7 kg)  03/08/20 272 lb (123.4 kg)  02/27/20 266 lb 6.4 oz (120.8 kg)    Health Maintenance Due  Topic Date Due  . Hepatitis C Screening  Never done  . HIV Screening  Never done  . TETANUS/TDAP  Never done    There are no preventive care reminders to display for this patient.   No results found for: TSH No results found for: WBC, HGB, HCT, MCV, PLT No results found for: NA, K, CHLORIDE, CO2, GLUCOSE, BUN, CREATININE, BILITOT, ALKPHOS, AST, ALT, PROT, ALBUMIN, CALCIUM, ANIONGAP, EGFR, GFR No results found for: CHOL No results found for: HDL No results found for: LDLCALC No results found for: TRIG No results found for: CHOLHDL No results found for: HGBA1C     Assessment & Plan:  1. Open wound of scrotum, subsequent encounter Sutures were removed and wound well healed.  He still has some draininga from a second drain hole.  Continue antibiotics.  Follow up if this does not clear or see his urologist         I  My nursing staff have aided in the documentation of this note on the behalf of Reinaldo Meeker, MD,as directed by  Reinaldo Meeker, MD and thoroughly reviewed by Reinaldo Meeker, MD.  Follow-up: Return if symptoms worsen or fail to improve.  An After Visit Summary was printed and given to the patient.  Reinaldo Meeker, MD [facility] [phone]

## 2020-07-28 ENCOUNTER — Ambulatory Visit: Payer: Self-pay | Admitting: Physician Assistant

## 2020-07-28 ENCOUNTER — Other Ambulatory Visit: Payer: Self-pay

## 2021-06-22 ENCOUNTER — Other Ambulatory Visit: Payer: Self-pay

## 2021-06-22 ENCOUNTER — Ambulatory Visit: Payer: BC Managed Care – PPO | Admitting: Physician Assistant

## 2021-06-22 ENCOUNTER — Encounter: Payer: Self-pay | Admitting: Physician Assistant

## 2021-06-22 ENCOUNTER — Ambulatory Visit (INDEPENDENT_AMBULATORY_CARE_PROVIDER_SITE_OTHER): Payer: BC Managed Care – PPO

## 2021-06-22 VITALS — BP 122/78 | HR 87 | Temp 97.5°F | Ht 73.5 in | Wt 274.2 lb

## 2021-06-22 DIAGNOSIS — E669 Obesity, unspecified: Secondary | ICD-10-CM

## 2021-06-22 DIAGNOSIS — R5381 Other malaise: Secondary | ICD-10-CM

## 2021-06-22 DIAGNOSIS — E7801 Familial hypercholesterolemia: Secondary | ICD-10-CM | POA: Diagnosis not present

## 2021-06-22 DIAGNOSIS — Z23 Encounter for immunization: Secondary | ICD-10-CM | POA: Diagnosis not present

## 2021-06-22 DIAGNOSIS — R635 Abnormal weight gain: Secondary | ICD-10-CM

## 2021-06-22 DIAGNOSIS — Z125 Encounter for screening for malignant neoplasm of prostate: Secondary | ICD-10-CM | POA: Diagnosis not present

## 2021-06-22 MED ORDER — WEGOVY 0.25 MG/0.5ML ~~LOC~~ SOAJ: 0.2500 mg | SUBCUTANEOUS | 0 refills | Status: DC

## 2021-06-22 NOTE — Progress Notes (Signed)
Subjective:  Patient ID: Jacob Stephens, male    DOB: 07-12-1970  Age: 51 y.o. MRN: [id number]  Chief Complaint  Patient presents with   Weight Gain    HPI  Pt complains of weight gain.  Has been watching diet and trying to exercise  -would like to try medication to help with weight loss  Pt would like labwork done as well because of weight gain and general malaise.  He is due for PSA screening as well  Pt would like to update tetanus booster No current outpatient medications on file prior to visit.   No current facility-administered medications on file prior to visit.   Past Medical History:  Diagnosis Date   Closed displaced fracture of shaft of left clavicle 01/14/2018   Formatting of this note might be different from the original. Added automatically from request for surgery [id number]   Epididymitis, right 02/05/2020   GERD (gastroesophageal reflux disease)    Low back pain    Past Surgical History:  Procedure Laterality Date   S/P surgical exploration of scrotum Right 02/05/2020    Family History  Problem Relation Age of Onset   Diverticulitis Mother    Thyroid disease Mother    Prostate cancer Father    Heart attack Father    Social History   Socioeconomic History   Marital status: Married    Spouse name: Not on file   Number of children: 2   Years of education: Not on file   Highest education level: Not on file  Occupational History   Occupation: Truck [employer] doctor  Tobacco Use   Smoking status: Never   Smokeless tobacco: Never  Substance and Sexual Activity   Alcohol use: Never   Drug use: Never   Sexual activity: Not Currently  Other Topics Concern   Not on file  Social History Narrative   Not on file   Social Determinants of Health   Financial Resource Strain: Not on file  Food Insecurity: Not on file  Transportation Needs: Not on file  Physical Activity: Not on file  Stress: Not on file  Social Connections: Not on file    Review of  Systems CONSTITUTIONAL: see HPI E/N/T: Negative for ear pain, nasal congestion and sore throat.  CARDIOVASCULAR: Negative for chest pain, dizziness, palpitations and pedal edema.  RESPIRATORY: Negative for recent cough and dyspnea.  GASTROINTESTINAL: Negative for abdominal pain, acid reflux symptoms, constipation, diarrhea, nausea and vomiting.  PSYCHIATRIC: Negative for sleep disturbance and to question depression screen.  Negative for depression, negative for anhedonia.       Objective:  BP 122/78 (BP Location: Right Arm, Patient Position: Sitting, Cuff Size: Large)    Pulse 87    Temp (!) 97.5 F (36.4 C) (Temporal)    Ht 6' 1.5" (1.867 m)    Wt 274 lb 3.2 oz (124.4 kg)    SpO2 97%    BMI 35.69 kg/m   BP/Weight 06/22/2021 03/19/2020 03/08/2020  Systolic BP 122 120 110  Diastolic BP 78 80 70  Wt. (Lbs) 274.2 266 272  BMI 35.69 35.09 35.89    Physical Exam PHYSICAL EXAM:   VS: BP 122/78 (BP Location: Right Arm, Patient Position: Sitting, Cuff Size: Large)    Pulse 87    Temp (!) 97.5 F (36.4 C) (Temporal)    Ht 6' 1.5" (1.867 m)    Wt 274 lb 3.2 oz (124.4 kg)    SpO2 97%    BMI 35.69 kg/m   GEN: Well  nourished, well developed, in no acute distress  Cardiac: RRR; no murmurs, Respiratory:  normal respiratory rate and pattern with no distress - normal breath sounds with no rales, rhonchi, wheezes or rubs  Psych: euthymic mood, appropriate affect and demeanor  Diabetic Foot Exam - Simple   No data filed      No results found for: WBC, HGB, HCT, PLT, GLUCOSE, CHOL, TRIG, HDL, LDLDIRECT, LDLCALC, ALT, AST, NA, K, CL, CREATININE, BUN, CO2, TSH, PSA, INR, GLUF, HGBA1C, MICROALBUR    Assessment & Plan:   Problem List Items Addressed This Visit   None Visit Diagnoses     Malaise    -  Primary   Relevant Orders   CBC with Differential/Platelet   Comprehensive metabolic panel   TSH   Weight gain       Relevant Orders   TSH   Prostate cancer screening       Relevant  Orders   PSA   Familial hypercholesterolemia       Relevant Orders   Lipid panel   Need for diphtheria-tetanus-pertussis (Tdap) vaccine       Relevant Orders   Tdap vaccine greater than or equal to 7yo IM (Completed)   Obesity (BMI 35.0-39.9 without comorbidity)       Relevant Medications   Semaglutide-Weight Management (WEGOVY) 0.25 MG/0.5ML SOAJ     .  Meds ordered this encounter  Medications   Semaglutide-Weight Management (WEGOVY) 0.25 MG/0.5ML SOAJ    Sig: Inject 0.25 mg into the skin once a week.    Dispense:  2 mL    Refill:  0    Order Specific Question:   Supervising Provider    AnswerCorey Harold    Orders Placed This Encounter  Procedures   Tdap vaccine greater than or equal to 7yo IM   CBC with Differential/Platelet   Comprehensive metabolic panel   TSH   Lipid panel   PSA     Follow-up: Return in about 6 weeks (around 08/03/2021) for follow up.al  An After Visit Summary was printed and given to the patient.  Jettie Pagan [facility] [phone]

## 2021-06-23 LAB — LIPID PANEL
Chol/HDL Ratio: 5.7 ratio — ABNORMAL HIGH (ref 0.0–5.0)
Cholesterol, Total: 232 mg/dL — ABNORMAL HIGH (ref 100–199)
HDL: 41 mg/dL (ref >39)
LDL Chol Calc (NIH): 169 mg/dL — ABNORMAL HIGH (ref 0–99)
Triglycerides: 119 mg/dL (ref 0–149)
VLDL Cholesterol Cal: 22 mg/dL (ref 5–40)

## 2021-06-23 LAB — COMPREHENSIVE METABOLIC PANEL
ALT: 46 IU/L — ABNORMAL HIGH (ref 0–44)
AST: 28 IU/L (ref 0–40)
Albumin/Globulin Ratio: 1.6 (ref 1.2–2.2)
Albumin: 4.7 g/dL (ref 4.0–5.0)
Alkaline Phosphatase: 82 IU/L (ref 44–121)
BUN/Creatinine Ratio: 19 (ref 9–20)
BUN: 20 mg/dL (ref 6–24)
Bilirubin Total: 0.4 mg/dL (ref 0.0–1.2)
CO2: 23 mmol/L (ref 20–29)
Calcium: 10 mg/dL (ref 8.7–10.2)
Chloride: 103 mmol/L (ref 96–106)
Creatinine, Ser: 1.06 mg/dL (ref 0.76–1.27)
Globulin, Total: 3 g/dL (ref 1.5–4.5)
Glucose: 82 mg/dL (ref 70–99)
Potassium: 4.5 mmol/L (ref 3.5–5.2)
Sodium: 139 mmol/L (ref 134–144)
Total Protein: 7.7 g/dL (ref 6.0–8.5)
eGFR: 85 mL/min/{1.73_m2} (ref >59)

## 2021-06-23 LAB — TSH: TSH: 5.22 u[IU]/mL — ABNORMAL HIGH (ref 0.450–4.500)

## 2021-06-23 LAB — CBC WITH DIFFERENTIAL/PLATELET
Basophils Absolute: 0 10*3/uL (ref 0.0–0.2)
Basos: 1 %
EOS (ABSOLUTE): 0.3 10*3/uL (ref 0.0–0.4)
Eos: 4 %
Hematocrit: 40.2 % (ref 37.5–51.0)
Hemoglobin: 13.6 g/dL (ref 13.0–17.7)
Immature Grans (Abs): 0 10*3/uL (ref 0.0–0.1)
Immature Granulocytes: 0 %
Lymphocytes Absolute: 2.2 10*3/uL (ref 0.7–3.1)
Lymphs: 30 %
MCH: 29 pg (ref 26.6–33.0)
MCHC: 33.8 g/dL (ref 31.5–35.7)
MCV: 86 fL (ref 79–97)
Monocytes Absolute: 0.7 10*3/uL (ref 0.1–0.9)
Monocytes: 10 %
Neutrophils Absolute: 4.1 10*3/uL (ref 1.4–7.0)
Neutrophils: 55 %
Platelets: 304 10*3/uL (ref 150–450)
RBC: 4.69 x10E6/uL (ref 4.14–5.80)
RDW: 12.9 % (ref 11.6–15.4)
WBC: 7.4 10*3/uL (ref 3.4–10.8)

## 2021-06-23 LAB — CARDIOVASCULAR RISK ASSESSMENT

## 2021-06-23 LAB — PSA: Prostate Specific Ag, Serum: 0.6 ng/mL (ref 0.0–4.0)

## 2021-06-24 ENCOUNTER — Other Ambulatory Visit: Payer: Self-pay | Admitting: Physician Assistant

## 2021-06-24 DIAGNOSIS — E782 Mixed hyperlipidemia: Secondary | ICD-10-CM

## 2021-06-24 MED ORDER — ROSUVASTATIN CALCIUM 5 MG PO TABS: 5.0000 mg | ORAL_TABLET | Freq: Every day | ORAL | 0 refills | Status: DC

## 2021-07-06 ENCOUNTER — Other Ambulatory Visit: Payer: Self-pay | Admitting: Physician Assistant

## 2021-07-06 DIAGNOSIS — R635 Abnormal weight gain: Secondary | ICD-10-CM

## 2021-07-06 MED ORDER — PHENTERMINE HCL 37.5 MG PO CAPS: 37.5000 mg | ORAL_CAPSULE | ORAL | 0 refills | Status: DC

## 2021-08-02 ENCOUNTER — Ambulatory Visit: Payer: BC Managed Care – PPO | Admitting: Physician Assistant

## 2021-08-03 ENCOUNTER — Ambulatory Visit: Payer: BC Managed Care – PPO | Admitting: Physician Assistant

## 2021-08-03 ENCOUNTER — Encounter: Payer: Self-pay | Admitting: Physician Assistant

## 2021-08-03 VITALS — BP 118/72 | HR 73 | Resp 16 | Ht 73.5 in | Wt 262.6 lb

## 2021-08-03 DIAGNOSIS — R899 Unspecified abnormal finding in specimens from other organs, systems and tissues: Secondary | ICD-10-CM | POA: Diagnosis not present

## 2021-08-03 DIAGNOSIS — R635 Abnormal weight gain: Secondary | ICD-10-CM | POA: Diagnosis not present

## 2021-08-03 MED ORDER — PHENTERMINE HCL 37.5 MG PO CAPS: 37.5000 mg | ORAL_CAPSULE | ORAL | 1 refills | Status: DC

## 2021-08-03 NOTE — Progress Notes (Signed)
? ?Subjective:  ?Patient ID: Jacob Stephens, male    DOB: Nov 19, 1970  Age: 51 y.o. MRN: [id number] ? ?Chief Complaint  ?Patient presents with  ? Weight Check  ? ? ?HPI ? Pt with history of slightly elevated TSH - is due to recheck - currently not on medication ? ?Pt with history of obesity - he was placed on adipex this past month and has done really well with medication - he has lost 12 pounds since last visit ?Current Outpatient Medications on File Prior to Visit  ?Medication Sig Dispense Refill  ? rosuvastatin (CRESTOR) 5 MG tablet Take 1 tablet (5 mg total) by mouth daily. 90 tablet 0  ? ?No current facility-administered medications on file prior to visit.  ? ?Past Medical History:  ?Diagnosis Date  ? Closed displaced fracture of shaft of left clavicle 01/14/2018  ? Formatting of this note might be different from the original. Added automatically from request for surgery [id number]  ? Epididymitis, right 02/05/2020  ? GERD (gastroesophageal reflux disease)   ? Low back pain   ? ?Past Surgical History:  ?Procedure Laterality Date  ? S/P surgical exploration of scrotum Right 02/05/2020  ?  ?Family History  ?Problem Relation Age of Onset  ? Diverticulitis Mother   ? Thyroid disease Mother   ? Prostate cancer Father   ? Heart attack Father   ? ?Social History  ? ?Socioeconomic History  ? Marital status: Married  ?  Spouse name: Not on file  ? Number of children: 2  ? Years of education: Not on file  ? Highest education level: Not on file  ?Occupational History  ? Occupation: Naval architect  ?Tobacco Use  ? Smoking status: Never  ? Smokeless tobacco: Never  ?Substance and Sexual Activity  ? Alcohol use: Never  ? Drug use: Never  ? Sexual activity: Not Currently  ?Other Topics Concern  ? Not on file  ?Social History Narrative  ? Not on file  ? ?Social Determinants of Health  ? ?Financial Resource Strain: Not on file  ?Food Insecurity: Not on file  ?Transportation Needs: Not on file  ?Physical Activity: Not on file  ?Stress:  Not on file  ?Social Connections: Not on file  ? ? ?Review of Systems ?CONSTITUTIONAL: Negative for chills, fatigue, fever, unintentional weight gain and unintentional weight loss.  ? ?CARDIOVASCULAR: Negative for chest pain, dizziness, palpitations and pedal edema.  ?RESPIRATORY: Negative for recent cough and dyspnea.  ?GASTROINTESTINAL: Negative for abdominal pain, acid reflux symptoms, constipation, diarrhea, nausea and vomiting.  ? ? ? ?Objective:  ?BP 118/72   Pulse 73   Resp 16   Ht 6' 1.5" (1.867 m)   Wt 262 lb 9.6 oz (119.1 kg)   SpO2 97%   BMI 34.18 kg/m?  ? ? ?  08/03/2021  ?  3:32 PM 06/22/2021  ?  3:36 PM 03/19/2020  ? 11:28 AM  ?BP/Weight  ?Systolic BP 118 122 120  ?Diastolic BP 72 78 80  ?Wt. (Lbs) 262.6 274.2 266  ?BMI 34.18 kg/m2 35.69 kg/m2 35.09 kg/m2  ? ? ?Physical Exam ?PHYSICAL EXAM:  ? ?VS: BP 118/72   Pulse 73   Resp 16   Ht 6' 1.5" (1.867 m)   Wt 262 lb 9.6 oz (119.1 kg)   SpO2 97%   BMI 34.18 kg/m?  ? ?GEN: Well nourished, well developed, in no acute distress  ?Cardiac: RRR; no murmurs, rubs ?Respiratory:  normal respiratory rate and pattern with no distress - normal breath  sounds with no rales, rhonchi, wheezes or rubs ?Psych: euthymic mood, appropriate affect and demeanor ? ?Diabetic Foot Exam - Simple   ?No data filed ?  ?  ? ?Lab Results  ?Component Value Date  ? WBC 7.4 06/22/2021  ? HGB 13.6 06/22/2021  ? HCT 40.2 06/22/2021  ? PLT 304 06/22/2021  ? GLUCOSE 82 06/22/2021  ? CHOL 232 (H) 06/22/2021  ? TRIG 119 06/22/2021  ? HDL 41 06/22/2021  ? LDLCALC 169 (H) 06/22/2021  ? ALT 46 (H) 06/22/2021  ? AST 28 06/22/2021  ? NA 139 06/22/2021  ? K 4.5 06/22/2021  ? CL 103 06/22/2021  ? CREATININE 1.06 06/22/2021  ? BUN 20 06/22/2021  ? CO2 23 06/22/2021  ? TSH 5.220 (H) 06/22/2021  ? ? ? ? ?Assessment & Plan:  ? ?Problem List Items Addressed This Visit   ?None ?Visit Diagnoses   ? ? Abnormal laboratory test    -  Primary  ? Relevant Orders  ? TSH  ? Weight gain      ? Relevant  Medications  ? phentermine 37.5 MG capsule  ? ?  ?. ? ?Meds ordered this encounter  ?Medications  ? phentermine 37.5 MG capsule  ?  Sig: Take 1 capsule (37.5 mg total) by mouth every morning.  ?  Dispense:  30 capsule  ?  Refill:  1  ?  Order Specific Question:   Supervising Provider  ?  AnswerBlane Ohara [[id number]]  ? ? ?Orders Placed This Encounter  ?Procedures  ? TSH  ?  ? ?Follow-up: Return for cpe on 6/20. ? ?An After Visit Summary was printed and given to the patient. ? ?SARA R Gurley Climer, PA-C ?[facility] ?[phone]

## 2021-08-04 LAB — TSH: TSH: 4.16 u[IU]/mL (ref 0.450–4.500)

## 2021-09-01 ENCOUNTER — Other Ambulatory Visit: Payer: Self-pay | Admitting: Physician Assistant

## 2021-09-01 DIAGNOSIS — E782 Mixed hyperlipidemia: Secondary | ICD-10-CM

## 2021-10-06 ENCOUNTER — Other Ambulatory Visit: Payer: Self-pay | Admitting: Physician Assistant

## 2021-10-06 DIAGNOSIS — R635 Abnormal weight gain: Secondary | ICD-10-CM

## 2021-10-10 ENCOUNTER — Other Ambulatory Visit: Payer: Self-pay

## 2021-10-10 DIAGNOSIS — E782 Mixed hyperlipidemia: Secondary | ICD-10-CM

## 2021-10-10 DIAGNOSIS — K219 Gastro-esophageal reflux disease without esophagitis: Secondary | ICD-10-CM

## 2021-10-11 ENCOUNTER — Other Ambulatory Visit: Payer: Self-pay

## 2021-10-11 ENCOUNTER — Other Ambulatory Visit: Payer: BC Managed Care – PPO

## 2021-10-11 DIAGNOSIS — K219 Gastro-esophageal reflux disease without esophagitis: Secondary | ICD-10-CM

## 2021-10-11 DIAGNOSIS — E782 Mixed hyperlipidemia: Secondary | ICD-10-CM

## 2021-10-12 LAB — CBC WITH DIFFERENTIAL/PLATELET
Basophils Absolute: 0 10*3/uL (ref 0.0–0.2)
Basos: 1 %
EOS (ABSOLUTE): 0.2 10*3/uL (ref 0.0–0.4)
Eos: 3 %
Hematocrit: 40.7 % (ref 37.5–51.0)
Hemoglobin: 13.4 g/dL (ref 13.0–17.7)
Immature Grans (Abs): 0 10*3/uL (ref 0.0–0.1)
Immature Granulocytes: 0 %
Lymphocytes Absolute: 2 10*3/uL (ref 0.7–3.1)
Lymphs: 35 %
MCH: 29.1 pg (ref 26.6–33.0)
MCHC: 32.9 g/dL (ref 31.5–35.7)
MCV: 89 fL (ref 79–97)
Monocytes Absolute: 0.5 10*3/uL (ref 0.1–0.9)
Monocytes: 8 %
Neutrophils Absolute: 3 10*3/uL (ref 1.4–7.0)
Neutrophils: 53 %
Platelets: 280 10*3/uL (ref 150–450)
RBC: 4.6 x10E6/uL (ref 4.14–5.80)
RDW: 12.6 % (ref 11.6–15.4)
WBC: 5.7 10*3/uL (ref 3.4–10.8)

## 2021-10-12 LAB — COMPREHENSIVE METABOLIC PANEL
ALT: 25 IU/L (ref 0–44)
AST: 19 IU/L (ref 0–40)
Albumin/Globulin Ratio: 1.5 (ref 1.2–2.2)
Albumin: 4.4 g/dL (ref 4.0–5.0)
Alkaline Phosphatase: 80 IU/L (ref 44–121)
BUN/Creatinine Ratio: 18 (ref 9–20)
BUN: 18 mg/dL (ref 6–24)
Bilirubin Total: 0.3 mg/dL (ref 0.0–1.2)
CO2: 22 mmol/L (ref 20–29)
Calcium: 9.7 mg/dL (ref 8.7–10.2)
Chloride: 105 mmol/L (ref 96–106)
Creatinine, Ser: 1 mg/dL (ref 0.76–1.27)
Globulin, Total: 3 g/dL (ref 1.5–4.5)
Glucose: 99 mg/dL (ref 70–99)
Potassium: 5.1 mmol/L (ref 3.5–5.2)
Sodium: 141 mmol/L (ref 134–144)
Total Protein: 7.4 g/dL (ref 6.0–8.5)
eGFR: 92 mL/min/{1.73_m2} (ref >59)

## 2021-10-12 LAB — LIPID PANEL
Chol/HDL Ratio: 3.5 ratio (ref 0.0–5.0)
Cholesterol, Total: 142 mg/dL (ref 100–199)
HDL: 41 mg/dL (ref >39)
LDL Chol Calc (NIH): 83 mg/dL (ref 0–99)
Triglycerides: 98 mg/dL (ref 0–149)
VLDL Cholesterol Cal: 18 mg/dL (ref 5–40)

## 2021-10-12 LAB — CARDIOVASCULAR RISK ASSESSMENT

## 2021-10-12 LAB — TSH: TSH: 4.71 u[IU]/mL — ABNORMAL HIGH (ref 0.450–4.500)

## 2021-10-13 ENCOUNTER — Other Ambulatory Visit: Payer: Self-pay | Admitting: Physician Assistant

## 2021-10-13 DIAGNOSIS — R899 Unspecified abnormal finding in specimens from other organs, systems and tissues: Secondary | ICD-10-CM

## 2021-10-18 ENCOUNTER — Other Ambulatory Visit: Payer: Self-pay | Admitting: Physician Assistant

## 2021-10-18 ENCOUNTER — Ambulatory Visit (INDEPENDENT_AMBULATORY_CARE_PROVIDER_SITE_OTHER): Payer: BC Managed Care – PPO | Admitting: Physician Assistant

## 2021-10-18 ENCOUNTER — Encounter: Payer: Self-pay | Admitting: Physician Assistant

## 2021-10-18 VITALS — BP 132/80 | HR 71 | Resp 18 | Ht 73.5 in | Wt 259.0 lb

## 2021-10-18 DIAGNOSIS — K219 Gastro-esophageal reflux disease without esophagitis: Secondary | ICD-10-CM | POA: Diagnosis not present

## 2021-10-18 DIAGNOSIS — E669 Obesity, unspecified: Secondary | ICD-10-CM

## 2021-10-18 DIAGNOSIS — Z0000 Encounter for general adult medical examination without abnormal findings: Secondary | ICD-10-CM | POA: Diagnosis not present

## 2021-10-18 LAB — POCT URINALYSIS DIP (CLINITEK)
Bilirubin, UA: NEGATIVE
Blood, UA: NEGATIVE
Glucose, UA: NEGATIVE mg/dL
Ketones, POC UA: NEGATIVE mg/dL
Leukocytes, UA: NEGATIVE
Nitrite, UA: NEGATIVE
Spec Grav, UA: 1.02 (ref 1.010–1.025)
Urobilinogen, UA: 0.2 E.U./dL
pH, UA: 6 (ref 5.0–8.0)

## 2021-10-18 MED ORDER — WEGOVY 0.25 MG/0.5ML ~~LOC~~ SOAJ: 0.2500 mg | SUBCUTANEOUS | 0 refills | Status: DC

## 2021-10-18 MED ORDER — PANTOPRAZOLE SODIUM 40 MG PO TBEC: 40.0000 mg | DELAYED_RELEASE_TABLET | Freq: Every day | ORAL | 3 refills | Status: DC

## 2021-10-18 NOTE — Progress Notes (Signed)
Subjective:  Patient ID: Jacob Stephens, male    DOB: 07-18-1970  Age: 51 y.o. MRN: [id number]  Chief Complaint  Patient presents with   Annual Exam    HPI  Well Adult Physical: Patient here for a comprehensive physical exam.The patient reports  he had been on adipex but now insurance changed and he would like to try wegovy ---- also he has issues with GERD  - would like rx Do you take any herbs or supplements that were not prescribed by a doctor? No Are you taking calcium supplements? no Are you taking aspirin daily? no  Encounter for general adult medical examination without abnormal findings  Physical ("At Risk" items are starred): Patient's last physical exam was 1 year ago .  Patient wears a seat belt, has smoke detectors, has carbon monoxide detectors, practices appropriate gun safety, and wears sunscreen with extended sun exposure. Dental Care: biannual cleanings, brushes and flosses daily. Ophthalmology/Optometry: is due Hearing loss: none Vision impairments: none Last PSA: this month - normal  Flowsheet Row Office Visit from 06/22/2021 in [facility]  PHQ-2 Total Score 0               Social History   Socioeconomic History   Marital status: Married    Spouse name: Not on file   Number of children: 2   Years of education: Not on file   Highest education level: Not on file  Occupational History   Occupation: [employer] doctor  Tobacco Use   Smoking status: Never   Smokeless tobacco: Never  Substance and Sexual Activity   Alcohol use: Never   Drug use: Never   Sexual activity: Not Currently  Other Topics Concern   Not on file  Social History Narrative   Not on file   Social Determinants of Health   Financial Resource Strain: Not on file  Food Insecurity: Not on file  Transportation Needs: Not on file  Physical Activity: Not on file  Stress: Not on file  Social Connections: Not on file   Past Medical History:  Diagnosis Date   Closed displaced  fracture of shaft of left clavicle 01/14/2018   Formatting of this note might be different from the original. Added automatically from request for surgery [id number]   Epididymitis, right 02/05/2020   GERD (gastroesophageal reflux disease)    Low back pain    Past Surgical History:  Procedure Laterality Date   S/P surgical exploration of scrotum Right 02/05/2020    Family History  Problem Relation Age of Onset   Diverticulitis Mother    Thyroid disease Mother    Prostate cancer Father    Heart attack Father    Social History   Socioeconomic History   Marital status: Married    Spouse name: Not on file   Number of children: 2   Years of education: Not on file   Highest education level: Not on file  Occupational History   Occupation: [employer] doctor  Tobacco Use   Smoking status: Never   Smokeless tobacco: Never  Substance and Sexual Activity   Alcohol use: Never   Drug use: Never   Sexual activity: Not Currently  Other Topics Concern   Not on file  Social History Narrative   Not on file   Social Determinants of Health   Financial Resource Strain: Not on file  Food Insecurity: Not on file  Transportation Needs: Not on file  Physical Activity: Not on file  Stress: Not on file  Social Connections: Not on file   Review of Systems CONSTITUTIONAL: Negative for chills, fatigue, fever, unintentional weight gain and unintentional weight loss.  E/N/T: Negative for ear pain, nasal congestion and sore throat.  CARDIOVASCULAR: Negative for chest pain, dizziness, palpitations and pedal edema.  RESPIRATORY: Negative for recent cough and dyspnea.  GASTROINTESTINAL: see HPI MSK: Negative for arthralgias and myalgias.  INTEGUMENTARY: Negative for rash.  NEUROLOGICAL: Negative for dizziness and headaches.  PSYCHIATRIC: Negative for sleep disturbance and to question depression screen.  Negative for depression, negative for anhedonia.       Objective:  PHYSICAL EXAM:   VS: BP  132/80   Pulse 71   Resp 18   Ht 6' 1.5" (1.867 m)   Wt 259 lb (117.5 kg)   SpO2 96%   BMI 33.71 kg/m   GEN: Well nourished, well developed, in no acute distress  HEENT: normal external ears and nose - normal external auditory canals and TMS - hearing grossly normal - normal nasal mucosa and septum - Lips, Teeth and Gums - normal  Oropharynx - normal mucosa, palate, and posterior pharynx Neck: no JVD or masses - no thyromegaly Cardiac: RRR; no murmurs, rubs, or gallops,no edema - no significant varicosities Respiratory:  normal respiratory rate and pattern with no distress - normal breath sounds with no rales, rhonchi, wheezes or rubs GI: normal bowel sounds, no masses or tenderness MS: no deformity or atrophy  Skin: warm and dry, no rash  Neuro:  Alert and Oriented x 3, Strength and sensation are intact - CN II-Xii grossly intact Psych: euthymic mood, appropriate affect and demeanor  Office Visit on 10/18/2021  Component Date Value Ref Range Status   Color, UA 10/18/2021 yellow  yellow Final   Clarity, UA 10/18/2021 clear  clear Final   Glucose, UA 10/18/2021 negative  negative mg/dL Final   Bilirubin, UA 70/96/2836 negative  negative Final   Ketones, POC UA 10/18/2021 negative  negative mg/dL Final   Spec Grav, UA 62/94/7654 1.020  1.010 - 1.025 Final   Blood, UA 10/18/2021 negative  negative Final   pH, UA 10/18/2021 6.0  5.0 - 8.0 Final   POC PROTEIN,UA 10/18/2021 trace  negative, trace Final   Urobilinogen, UA 10/18/2021 0.2  0.2 or 1.0 E.U./dL Final   Nitrite, UA 65/06/5463 Negative  Negative Final   Leukocytes, UA 10/18/2021 Negative  Negative Final    Lab Results  Component Value Date   WBC 5.7 10/11/2021   HGB 13.4 10/11/2021   HCT 40.7 10/11/2021   PLT 280 10/11/2021   GLUCOSE 99 10/11/2021   CHOL 142 10/11/2021   TRIG 98 10/11/2021   HDL 41 10/11/2021   LDLCALC 83 10/11/2021   ALT 25 10/11/2021   AST 19 10/11/2021   NA 141 10/11/2021   K 5.1 10/11/2021    CL 105 10/11/2021   CREATININE 1.00 10/11/2021   BUN 18 10/11/2021   CO2 22 10/11/2021   TSH 4.710 (H) 10/11/2021      Assessment & Plan:   Problem List Items Addressed This Visit       Digestive   GERD (gastroesophageal reflux disease)   Relevant Medications   pantoprazole (PROTONIX) 40 MG tablet     Other   Annual physical exam - Primary   Relevant Orders   POCT URINALYSIS DIP (CLINITEK) (Completed)   Obesity (BMI 35.0-39.9 without comorbidity)    Body mass index is 33.71 kg/m.   These are the goals we discussed:  Goals  None      This is a list of the screening recommended for you and due dates:  Health Maintenance  Topic Date Due   Zoster (Shingles) Vaccine (1 of 2) 01/18/2022*   Cologuard (Stool DNA test)  10/19/2022*   Flu Shot  11/29/2021   Tetanus Vaccine  06/23/2031   COVID-19 Vaccine  Completed   HPV Vaccine  Aged Out   Hepatitis C Screening: USPSTF Recommendation to screen - Ages 18-79 yo.  Discontinued   HIV Screening  Discontinued  *Topic was postponed. The date shown is not the original due date.     Meds ordered this encounter  Medications   pantoprazole (PROTONIX) 40 MG tablet    Sig: Take 1 tablet (40 mg total) by mouth daily.    Dispense:  30 tablet    Refill:  3    Order Specific Question:   Supervising Provider    Answer:   Corey Harold   DISCONTD: Semaglutide-Weight Management (WEGOVY) 0.25 MG/0.5ML SOAJ    Sig: Inject 0.25 mg into the skin once a week.    Dispense:  2 mL    Refill:  0    Order Specific Question:   Supervising Provider    Answer:   Corey Harold     Follow-up: Return if symptoms worsen or fail to improve.  An After Visit Summary was printed and given to the patient.  Jettie Pagan [facility] [phone]

## 2021-10-20 ENCOUNTER — Telehealth: Payer: Self-pay

## 2021-11-20 ENCOUNTER — Other Ambulatory Visit: Payer: Self-pay | Admitting: Physician Assistant

## 2021-11-20 DIAGNOSIS — E669 Obesity, unspecified: Secondary | ICD-10-CM

## 2021-11-21 ENCOUNTER — Other Ambulatory Visit: Payer: Self-pay | Admitting: Physician Assistant

## 2021-11-21 DIAGNOSIS — E669 Obesity, unspecified: Secondary | ICD-10-CM

## 2021-11-21 MED ORDER — WEGOVY 0.5 MG/0.5ML ~~LOC~~ SOAJ: 0.5000 mg | SUBCUTANEOUS | 0 refills | Status: DC

## 2021-11-23 ENCOUNTER — Ambulatory Visit: Payer: BC Managed Care – PPO | Admitting: Physician Assistant

## 2021-11-24 ENCOUNTER — Ambulatory Visit: Payer: BC Managed Care – PPO | Admitting: Physician Assistant

## 2021-12-04 ENCOUNTER — Other Ambulatory Visit: Payer: Self-pay | Admitting: Physician Assistant

## 2021-12-04 DIAGNOSIS — E782 Mixed hyperlipidemia: Secondary | ICD-10-CM

## 2021-12-09 ENCOUNTER — Other Ambulatory Visit: Payer: BC Managed Care – PPO

## 2021-12-09 DIAGNOSIS — R899 Unspecified abnormal finding in specimens from other organs, systems and tissues: Secondary | ICD-10-CM

## 2021-12-10 LAB — TSH: TSH: 2.85 u[IU]/mL (ref 0.450–4.500)

## 2021-12-26 ENCOUNTER — Other Ambulatory Visit: Payer: Self-pay | Admitting: Physician Assistant

## 2021-12-26 ENCOUNTER — Ambulatory Visit (INDEPENDENT_AMBULATORY_CARE_PROVIDER_SITE_OTHER): Payer: BC Managed Care – PPO | Admitting: Physician Assistant

## 2021-12-26 ENCOUNTER — Encounter: Payer: Self-pay | Admitting: Physician Assistant

## 2021-12-26 VITALS — BP 122/66 | HR 76 | Resp 18 | Ht 73.5 in | Wt 264.4 lb

## 2021-12-26 DIAGNOSIS — K219 Gastro-esophageal reflux disease without esophagitis: Secondary | ICD-10-CM

## 2021-12-26 DIAGNOSIS — E669 Obesity, unspecified: Secondary | ICD-10-CM | POA: Diagnosis not present

## 2021-12-26 MED ORDER — WEGOVY 1 MG/0.5ML ~~LOC~~ SOAJ: 1.0000 mg | SUBCUTANEOUS | 0 refills | Status: DC

## 2021-12-26 MED ORDER — PANTOPRAZOLE SODIUM 40 MG PO TBEC: 40.0000 mg | DELAYED_RELEASE_TABLET | Freq: Every day | ORAL | 3 refills | Status: DC

## 2021-12-26 NOTE — Progress Notes (Signed)
Subjective:  Patient ID: Jacob Stephens, male    DOB: Sep 26, 1970  Age: 51 y.o. MRN: [id number]  Chief Complaint  Patient presents with   Weight Management Screening    HPI  Pt in today for follow up of weight management.  He is currently on wegovy 0.5mg  weekly - he actually gained a few pounds since last visit however he is back to eating regular foods (he had been doing slimfast for several weeks) - did explain he is at more of a starting dose for wegovy and the fact he is eating normally instead of slimfast could account for the weight change  Pt with history of GERD - stable on protonix 40mg  qd Current Outpatient Medications on File Prior to Visit  Medication Sig Dispense Refill   pantoprazole (PROTONIX) 40 MG tablet Take 1 tablet (40 mg total) by mouth daily. 30 tablet 3   rosuvastatin (CRESTOR) 5 MG tablet TAKE 1 TABLET(5 MG) BY MOUTH DAILY 90 tablet 0   No current facility-administered medications on file prior to visit.   Past Medical History:  Diagnosis Date   Closed displaced fracture of shaft of left clavicle 01/14/2018   Formatting of this note might be different from the original. Added automatically from request for surgery 01/16/2018   Epididymitis, right 02/05/2020   GERD (gastroesophageal reflux disease)    Low back pain    Past Surgical History:  Procedure Laterality Date   S/P surgical exploration of scrotum Right 02/05/2020    Family History  Problem Relation Age of Onset   Diverticulitis Mother    Thyroid disease Mother    Prostate cancer Father    Heart attack Father    Social History   Socioeconomic History   Marital status: Married    Spouse name: Not on file   Number of children: 2   Years of education: Not on file   Highest education level: Not on file  Occupational History   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   CARDIOVASCULAR: Negative for chest pain, RESPIRATORY: Negative for recent cough and dyspnea.  GASTROINTESTINAL: Negative for abdominal pain, acid reflux symptoms, constipation, diarrhea, nausea and vomiting.       Objective:  PHYSICAL EXAM:   VS: BP 122/66   Pulse 76   Resp 18   Ht 6' 1.5" (1.867 m)   Wt 264 lb 6.4 oz (119.9 kg)   SpO2 97%   BMI 34.41 kg/m   GEN: Well nourished, well developed, in no acute distress   Cardiac: RRR; no murmurs,  Respiratory:  normal respiratory rate and pattern with no distress - normal breath sounds with no rales, rhonchi, wheezes or rubs  Skin: warm and dry, no rash     Lab Results  Component Value Date   WBC 5.7 10/11/2021   HGB 13.4 10/11/2021   HCT  40.7 10/11/2021   PLT 280 10/11/2021   GLUCOSE 99 10/11/2021   CHOL 142 10/11/2021   TRIG 98 10/11/2021   HDL 41 10/11/2021   LDLCALC 83 10/11/2021   ALT 25 10/11/2021   AST 19 10/11/2021   NA 141 10/11/2021   K 5.1 10/11/2021   CL 105 10/11/2021   CREATININE 1.00 10/11/2021   BUN 18 10/11/2021   CO2 22 10/11/2021   TSH 2.850 12/09/2021      Assessment & Plan:   Problem List Items Addressed This Visit       Digestive   GERD (gastroesophageal reflux disease) - Primary Continue current meds     Other   Obesity (BMI 35.0-39.9 without comorbidity)   Relevant Medications   Semaglutide-Weight Management (WEGOVY) 1 MG/0.5ML SOAJ Will increase wegovy dose - watch diet  .  Meds ordered this encounter   Medications   Semaglutide-Weight Management (WEGOVY) 1 MG/0.5ML SOAJ    Sig: Inject 1 mg into the skin once a week.    Dispense:  2 mL    Refill:  0    Order Specific Question:   Supervising Provider    Answer:   Corey Harold    No orders of the defined types were placed in this encounter.    Follow-up: Return in about 3 months (around 03/28/2022) for follow up.  An After Visit Summary was printed and given to the patient.  Jettie Pagan [facility] [phone]

## 2022-01-20 ENCOUNTER — Telehealth: Payer: Self-pay

## 2022-01-20 ENCOUNTER — Other Ambulatory Visit: Payer: Self-pay | Admitting: Physician Assistant

## 2022-01-20 DIAGNOSIS — E669 Obesity, unspecified: Secondary | ICD-10-CM

## 2022-01-20 MED ORDER — WEGOVY 1.7 MG/0.75ML ~~LOC~~ SOAJ: 1.7000 mg | SUBCUTANEOUS | 0 refills | Status: DC

## 2022-01-20 NOTE — Telephone Encounter (Signed)
Patient needs a refill on wegovy and his wife asked if he will continue with 1 mg or you will increase the dose. Can you please send a refill to [facility] in [place].

## 2022-01-20 NOTE — Telephone Encounter (Signed)
Patient was informed.

## 2022-01-20 NOTE — Telephone Encounter (Signed)
Will increase dose - will send into pharmacy

## 2022-02-21 ENCOUNTER — Telehealth: Payer: Self-pay

## 2022-02-21 ENCOUNTER — Other Ambulatory Visit: Payer: Self-pay | Admitting: Physician Assistant

## 2022-02-21 MED ORDER — WEGOVY 2.4 MG/0.75ML ~~LOC~~ SOAJ: 2.4000 mg | SUBCUTANEOUS | 1 refills | Status: DC

## 2022-02-21 NOTE — Telephone Encounter (Signed)
Patient called stating that he has finished the month dose of the 1.7 of the wegovy and states that he is tolerating it well. He is wanting to know if you can send in the next dose to [facility] [place].

## 2022-02-21 NOTE — Telephone Encounter (Signed)
Will send in next dose - to make follow up appt in one month

## 2022-03-10 ENCOUNTER — Other Ambulatory Visit: Payer: Self-pay

## 2022-03-10 DIAGNOSIS — E782 Mixed hyperlipidemia: Secondary | ICD-10-CM

## 2022-03-10 MED ORDER — ROSUVASTATIN CALCIUM 5 MG PO TABS: ORAL_TABLET | ORAL | 0 refills | Status: DC

## 2022-03-17 ENCOUNTER — Other Ambulatory Visit: Payer: Self-pay

## 2022-03-17 MED ORDER — WEGOVY 2.4 MG/0.75ML ~~LOC~~ SOAJ: 2.4000 mg | SUBCUTANEOUS | 1 refills | Status: DC

## 2022-03-29 ENCOUNTER — Encounter: Payer: Self-pay | Admitting: Physician Assistant

## 2022-03-29 ENCOUNTER — Ambulatory Visit: Payer: BC Managed Care – PPO | Admitting: Physician Assistant

## 2022-03-29 VITALS — BP 116/74 | HR 72 | Temp 96.9°F | Ht 73.0 in | Wt 255.4 lb

## 2022-03-29 DIAGNOSIS — Z23 Encounter for immunization: Secondary | ICD-10-CM | POA: Diagnosis not present

## 2022-03-29 DIAGNOSIS — K219 Gastro-esophageal reflux disease without esophagitis: Secondary | ICD-10-CM

## 2022-03-29 DIAGNOSIS — E782 Mixed hyperlipidemia: Secondary | ICD-10-CM

## 2022-03-29 MED ORDER — ROSUVASTATIN CALCIUM 5 MG PO TABS: ORAL_TABLET | ORAL | 1 refills | Status: DC

## 2022-03-29 NOTE — Progress Notes (Signed)
Subjective:  Patient ID: Jacob Stephens, male    DOB: 05-16-1970  Age: 51 y.o. MRN: [id number]  Chief Complaint  Patient presents with   GERD Weight gain    HPI  Pt with history of GERD - stable on protonix 40mg  qd  Pt requests refill of crestor 5mg  qd  Pt doing well on Wegovy - currently on 2.4mg  weekly dosing - states is tolerating medication well - has lost 9 pounds since last visit  Pt would like flu vaccine Current Outpatient Medications on File Prior to Visit  Medication Sig Dispense Refill   pantoprazole (PROTONIX) 40 MG tablet Take 1 tablet (40 mg total) by mouth daily. 30 tablet 3   Semaglutide-Weight Management (WEGOVY) 2.4 MG/0.75ML SOAJ Inject 2.4 mg into the skin once a week. 3 mL 1   No current facility-administered medications on file prior to visit.   Past Medical History:  Diagnosis Date   Closed displaced fracture of shaft of left clavicle 01/14/2018   Formatting of this note might be different from the original. Added automatically from request for surgery 06-09-1990   Epididymitis, right 02/05/2020   GERD (gastroesophageal reflux disease)    Low back pain    Past Surgical History:  Procedure Laterality Date   S/P surgical exploration of scrotum Right 02/05/2020    Family History  Problem Relation Age of Onset   Diverticulitis Mother    Thyroid disease Mother    Prostate cancer Father    Heart attack Father    Social History   Socioeconomic History   Marital status: Married    Spouse name: Not on file   Number of children: 2   Years of education: Not on file   Highest education level: Not on file  Occupational History   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  CARDIOVASCULAR: Negative for chest pain, dizziness, palpitations and pedal edema.  RESPIRATORY: Negative for recent cough and dyspnea.  GASTROINTESTINAL: Negative for abdominal pain, acid reflux symptoms, constipation, diarrhea, nausea and vomiting.       Objective:  PHYSICAL EXAM:   VS: BP 116/74   Pulse 72   Temp (!) 96.9 F (36.1 C)   Ht 6\' 1"  (1.854 m)   Wt 255 lb 6.4 oz (115.8 kg)   SpO2 99%   BMI 33.70 kg/m   GEN: Well nourished, well developed, in no acute distress  Cardiac: RRR; no murmurs Respiratory:  normal respiratory rate and pattern with no distress - normal breath sounds with no rales, rhonchi, wheezes or rubs  Psych: euthymic mood, appropriate affect and demeanor   Lab Results  Component Value Date   WBC 5.7 10/11/2021   HGB 13.4 10/11/2021   HCT 40.7 10/11/2021   PLT 280 10/11/2021   GLUCOSE 99 10/11/2021   CHOL 142 10/11/2021   TRIG 98 10/11/2021   HDL 41 10/11/2021   LDLCALC 83 10/11/2021   ALT  25 10/11/2021   AST 19 10/11/2021   NA 141 10/11/2021   K 5.1 10/11/2021   CL 105 10/11/2021   CREATININE 1.00 10/11/2021   BUN 18 10/11/2021   CO2 22 10/11/2021   TSH 2.850 12/09/2021      Assessment & Plan:   Problem List Items Addressed This Visit       Digestive   GERD (gastroesophageal reflux disease) - Primary Continue current meds     Other   Obesity (BMI 35.0-39.9 without comorbidity)   Relevant Medications   Continue wegovy 2.4mg  weekly  Need flu vaccine Flucelvax given  .  Meds ordered this encounter  Medications   rosuvastatin (CRESTOR) 5 MG tablet    Sig: TAKE 1 TABLET(5 MG) BY MOUTH DAILY    Dispense:  90 tablet    Refill:  1    Order Specific Question:   Supervising Provider    AnswerShelton Silvas    Orders Placed This Encounter  Procedures   Flu Vaccine MDCK QUAD PF     Follow-up: Return in about 3 months (around 06/29/2022) for follow up.  An After Visit Summary was printed and given to the patient.  Yetta Flock [facility] [phone]

## 2022-04-23 ENCOUNTER — Other Ambulatory Visit: Payer: Self-pay | Admitting: Physician Assistant

## 2022-04-23 DIAGNOSIS — K219 Gastro-esophageal reflux disease without esophagitis: Secondary | ICD-10-CM

## 2022-06-06 ENCOUNTER — Other Ambulatory Visit: Payer: Self-pay | Admitting: Physician Assistant

## 2022-06-08 ENCOUNTER — Telehealth: Payer: Self-pay

## 2022-06-08 NOTE — Telephone Encounter (Signed)
[facility] denied the coverage of Wegovy 2.4 mg due to Patient did not have good outcomes with this drug.

## 2022-06-13 ENCOUNTER — Telehealth: Payer: Self-pay

## 2022-06-13 NOTE — Telephone Encounter (Signed)
Called patient to notified him that PA for wegovy was denied due to patient not meeting the 5% requirements. Patient wife made aware, Patient will come by for a weight check nurse visit.

## 2022-06-14 NOTE — Telephone Encounter (Signed)
Called patient spoke with his wife patient will come by for a nurse visit for weight check

## 2022-06-23 ENCOUNTER — Ambulatory Visit: Payer: BC Managed Care – PPO

## 2022-06-23 NOTE — Progress Notes (Signed)
Patient came in today for weight check for weight check.

## 2022-07-03 ENCOUNTER — Ambulatory Visit: Payer: BC Managed Care – PPO | Admitting: Physician Assistant

## 2022-07-25 ENCOUNTER — Ambulatory Visit: Payer: BC Managed Care – PPO | Admitting: Physician Assistant

## 2022-08-17 ENCOUNTER — Ambulatory Visit: Payer: BC Managed Care – PPO | Admitting: Physician Assistant

## 2022-09-01 ENCOUNTER — Other Ambulatory Visit: Payer: Self-pay | Admitting: Physician Assistant

## 2022-09-01 DIAGNOSIS — K219 Gastro-esophageal reflux disease without esophagitis: Secondary | ICD-10-CM

## 2022-09-28 ENCOUNTER — Other Ambulatory Visit: Payer: Self-pay | Admitting: Physician Assistant

## 2022-09-28 DIAGNOSIS — E782 Mixed hyperlipidemia: Secondary | ICD-10-CM

## 2022-10-31 ENCOUNTER — Encounter: Payer: BC Managed Care – PPO | Admitting: Physician Assistant

## 2022-12-31 ENCOUNTER — Other Ambulatory Visit: Payer: Self-pay | Admitting: Family Medicine

## 2022-12-31 DIAGNOSIS — E782 Mixed hyperlipidemia: Secondary | ICD-10-CM

## 2023-06-21 ENCOUNTER — Other Ambulatory Visit: Payer: Self-pay | Admitting: Family Medicine

## 2023-06-21 DIAGNOSIS — K219 Gastro-esophageal reflux disease without esophagitis: Secondary | ICD-10-CM

## 2023-06-29 ENCOUNTER — Other Ambulatory Visit: Payer: Self-pay | Admitting: Physician Assistant

## 2023-06-29 DIAGNOSIS — E782 Mixed hyperlipidemia: Secondary | ICD-10-CM

## 2023-08-16 ENCOUNTER — Ambulatory Visit: Payer: Self-pay | Admitting: Physician Assistant

## 2023-08-20 ENCOUNTER — Encounter: Payer: Self-pay | Admitting: Physician Assistant

## 2023-08-20 ENCOUNTER — Ambulatory Visit (INDEPENDENT_AMBULATORY_CARE_PROVIDER_SITE_OTHER): Payer: Self-pay | Admitting: Physician Assistant

## 2023-08-20 VITALS — BP 122/70 | HR 70 | Resp 16 | Ht 73.0 in | Wt 277.2 lb

## 2023-08-20 DIAGNOSIS — R5383 Other fatigue: Secondary | ICD-10-CM

## 2023-08-20 DIAGNOSIS — E782 Mixed hyperlipidemia: Secondary | ICD-10-CM | POA: Diagnosis not present

## 2023-08-20 DIAGNOSIS — E669 Obesity, unspecified: Secondary | ICD-10-CM | POA: Diagnosis not present

## 2023-08-20 DIAGNOSIS — Z125 Encounter for screening for malignant neoplasm of prostate: Secondary | ICD-10-CM

## 2023-08-20 NOTE — Progress Notes (Signed)
 Subjective:  Patient ID: Jacob Stephens, male    DOB: 04-06-1971  Age: 53 y.o. MRN: [id number]  Chief Complaint  Patient presents with   Medical Management of Chronic Issues   Fatigue    HPI  Pt in today for chronic follow up Pt with GERD - currently on protonix  40mg  qd and stable on this medication  Pt with hyperlipidemia - currently not on med but due for labwork  Pt is having trouble losing weight - he had been on wegovy  and had done well but insurance stopped paying for medication.  He is watching his diet and staying active.  Would like labwork drawn to see if any issues with thyroid  or testosterone  levels.  Is having some mild fatigue    08/20/2023    3:05 PM 03/29/2022    3:24 PM 06/22/2021    3:37 PM 02/24/2020    8:58 AM  Depression screen PHQ 2/9  Decreased Interest 0 0 0 0  Down, Depressed, Hopeless 0  0 0  PHQ - 2 Score 0 0 0 0        03/19/2020   12:04 PM  Fall Risk  Falls in the past year? 0  Was there an injury with Fall? 0  Fall Risk Category Calculator 0  Fall Risk Category (Retired) Low  (RETIRED) Patient Fall Risk Level Low fall risk  Fall risk Follow up Falls evaluation completed    CONSTITUTIONAL: see HPI  CARDIOVASCULAR: Negative for chest pain, dizziness, palpitations and pedal edema.  RESPIRATORY: Negative for recent cough and dyspnea.  GASTROINTESTINAL: Negative for abdominal pain, acid reflux symptoms, constipation, diarrhea, nausea and vomiting.   PSYCHIATRIC: Negative for sleep disturbance and to question depression screen.  Negative for depression, negative for anhedonia.       Current Outpatient Medications:    pantoprazole  (PROTONIX ) 40 MG tablet, TAKE 1 TABLET(40 MG) BY MOUTH DAILY, Disp: 90 tablet, Rfl: 3   rosuvastatin  (CRESTOR ) 5 MG tablet, TAKE 1 TABLET(5 MG) BY MOUTH DAILY (Patient not taking: Reported on 08/20/2023), Disp: 90 tablet, Rfl: 1   WEGOVY  2.4 MG/0.75ML SOAJ, INJECT 2.4 MG UNDER THE SKIN ONCE WEEKLY (Patient not taking:  Reported on 08/20/2023), Disp: 3 mL, Rfl: 1  Past Medical History:  Diagnosis Date   Closed displaced fracture of shaft of left clavicle 01/14/2018   Formatting of this note might be different from the original. Added automatically from request for surgery [id number]   Epididymitis, right 02/05/2020   GERD (gastroesophageal reflux disease)    Low back pain    Objective:  PHYSICAL EXAM:   VS: BP 122/70   Pulse 70   Resp 16   Ht 6\' 1"  (1.854 m)   Wt 277 lb 3.2 oz (125.7 kg)   SpO2 97%   BMI 36.57 kg/m   GEN: Well nourished, well developed, in no acute distress  Cardiac: RRR; no murmurs, Respiratory:  normal respiratory rate and pattern with no distress - normal breath sounds with no rales, rhonchi, wheezes or rubs  Skin: warm and dry, no rash   Psych: euthymic mood, appropriate affect and demeanor  Assessment & Plan:    Mixed hyperlipidemia -     Comprehensive metabolic panel with GFR; Future -     Lipid panel; Future  Obesity (BMI 35.0-39.9 without comorbidity) -     Comprehensive metabolic panel with GFR; Future -     Hemoglobin A1c; Future -     TSH; Future  Other fatigue -  CBC with Differential/Platelet; Future -     Comprehensive metabolic panel with GFR; Future -     Testosterone ,Free and Total; Future -     TSH; Future  Screening PSA (prostate specific antigen) -     PSA; Future     Follow-up: pending lab results  An After Visit Summary was printed and given to the patient.  Anthonette Bastos [facility] [phone]

## 2023-08-21 ENCOUNTER — Other Ambulatory Visit

## 2023-08-21 DIAGNOSIS — E782 Mixed hyperlipidemia: Secondary | ICD-10-CM

## 2023-08-21 DIAGNOSIS — R5383 Other fatigue: Secondary | ICD-10-CM

## 2023-08-21 DIAGNOSIS — E669 Obesity, unspecified: Secondary | ICD-10-CM

## 2023-08-21 DIAGNOSIS — Z125 Encounter for screening for malignant neoplasm of prostate: Secondary | ICD-10-CM

## 2023-08-21 LAB — CBC WITH DIFFERENTIAL/PLATELET
Basophils Absolute: 0 10*3/uL (ref 0.0–0.2)
Basos: 1 %
EOS (ABSOLUTE): 0.2 10*3/uL (ref 0.0–0.4)
Eos: 3 %
Hematocrit: 40.8 % (ref 37.5–51.0)
Hemoglobin: 13.4 g/dL (ref 13.0–17.7)
Immature Grans (Abs): 0 10*3/uL (ref 0.0–0.1)
Immature Granulocytes: 1 %
Lymphocytes Absolute: 1.8 10*3/uL (ref 0.7–3.1)
Lymphs: 28 %
MCH: 28.8 pg (ref 26.6–33.0)
MCHC: 32.8 g/dL (ref 31.5–35.7)
MCV: 88 fL (ref 79–97)
Monocytes Absolute: 0.6 10*3/uL (ref 0.1–0.9)
Monocytes: 9 %
Neutrophils Absolute: 3.8 10*3/uL (ref 1.4–7.0)
Neutrophils: 58 %
Platelets: 263 10*3/uL (ref 150–450)
RBC: 4.66 x10E6/uL (ref 4.14–5.80)
RDW: 12.5 % (ref 11.6–15.4)
WBC: 6.4 10*3/uL (ref 3.4–10.8)

## 2023-08-22 LAB — COMPREHENSIVE METABOLIC PANEL WITH GFR
ALT: 31 IU/L (ref 0–44)
AST: 22 IU/L (ref 0–40)
Albumin: 4.4 g/dL (ref 3.8–4.9)
Alkaline Phosphatase: 65 IU/L (ref 44–121)
BUN/Creatinine Ratio: 15 (ref 9–20)
BUN: 15 mg/dL (ref 6–24)
Bilirubin Total: 0.4 mg/dL (ref 0.0–1.2)
CO2: 23 mmol/L (ref 20–29)
Calcium: 9.5 mg/dL (ref 8.7–10.2)
Chloride: 106 mmol/L (ref 96–106)
Creatinine, Ser: 1.03 mg/dL (ref 0.76–1.27)
Globulin, Total: 2.9 g/dL (ref 1.5–4.5)
Glucose: 99 mg/dL (ref 70–99)
Potassium: 4.9 mmol/L (ref 3.5–5.2)
Sodium: 142 mmol/L (ref 134–144)
Total Protein: 7.3 g/dL (ref 6.0–8.5)
eGFR: 87 mL/min/{1.73_m2} (ref >59)

## 2023-08-22 LAB — LIPID PANEL
Chol/HDL Ratio: 5.8 ratio — ABNORMAL HIGH (ref 0.0–5.0)
Cholesterol, Total: 228 mg/dL — ABNORMAL HIGH (ref 100–199)
HDL: 39 mg/dL — ABNORMAL LOW (ref >39)
LDL Chol Calc (NIH): 161 mg/dL — ABNORMAL HIGH (ref 0–99)
Triglycerides: 151 mg/dL — ABNORMAL HIGH (ref 0–149)
VLDL Cholesterol Cal: 28 mg/dL (ref 5–40)

## 2023-08-22 LAB — TESTOSTERONE,FREE AND TOTAL
Testosterone, Free: 2.5 pg/mL — ABNORMAL LOW (ref 7.2–24.0)
Testosterone: 362 ng/dL (ref 264–916)

## 2023-08-22 LAB — HEMOGLOBIN A1C
Est. average glucose Bld gHb Est-mCnc: 108 mg/dL
Hgb A1c MFr Bld: 5.4 % (ref 4.8–5.6)

## 2023-08-22 LAB — TSH: TSH: 2.79 u[IU]/mL (ref 0.450–4.500)

## 2023-08-22 LAB — PSA: Prostate Specific Ag, Serum: 0.6 ng/mL (ref 0.0–4.0)

## 2023-08-24 ENCOUNTER — Other Ambulatory Visit: Payer: Self-pay | Admitting: Physician Assistant

## 2023-08-24 DIAGNOSIS — E782 Mixed hyperlipidemia: Secondary | ICD-10-CM

## 2023-08-24 MED ORDER — ROSUVASTATIN CALCIUM 5 MG PO TABS: ORAL_TABLET | ORAL | 0 refills | Status: DC

## 2023-09-03 ENCOUNTER — Other Ambulatory Visit: Payer: Self-pay | Admitting: Physician Assistant

## 2023-09-03 MED ORDER — PHENTERMINE HCL 37.5 MG PO TABS: 37.5000 mg | ORAL_TABLET | Freq: Every day | ORAL | 1 refills | Status: DC

## 2023-10-03 NOTE — Telephone Encounter (Signed)
 Called patient wife made her aware that patient insurance will not cover wegovy . Patient wife stated she will call back once she talk to him about it, made her aware that I would get more information on compound pharmacy and call her back about that as well.

## 2023-10-03 NOTE — Telephone Encounter (Signed)
 Copied from CRM [id number]. Topic: Clinical - Medication Question >> Oct 03, 2023  2:35 PM Ethelle Herb L wrote: Reason for CRM: Pt's wife calling as pt advised to call once pherntermine is due for refill. Pt requested for wife to call and request wegovy .  Pt's wife requesting a prior authorization to be completed for the wegovy  to ensure it would be covered before requesting rx. Pt's wife called insurance and informed a prior authorization would be needed for the wegovy .

## 2023-10-08 ENCOUNTER — Other Ambulatory Visit: Payer: Self-pay

## 2023-10-08 MED ORDER — PHENTERMINE HCL 37.5 MG PO TABS: 37.5000 mg | ORAL_TABLET | Freq: Every day | ORAL | 1 refills | Status: DC

## 2023-11-23 ENCOUNTER — Other Ambulatory Visit: Payer: Self-pay | Admitting: Physician Assistant

## 2023-11-23 DIAGNOSIS — E782 Mixed hyperlipidemia: Secondary | ICD-10-CM

## 2023-12-05 ENCOUNTER — Encounter: Payer: Self-pay | Admitting: Physician Assistant

## 2023-12-05 ENCOUNTER — Ambulatory Visit (INDEPENDENT_AMBULATORY_CARE_PROVIDER_SITE_OTHER): Admitting: Physician Assistant

## 2023-12-05 VITALS — BP 142/72 | HR 80 | Temp 97.5°F | Ht 73.0 in | Wt 272.2 lb

## 2023-12-05 DIAGNOSIS — Z0000 Encounter for general adult medical examination without abnormal findings: Secondary | ICD-10-CM | POA: Diagnosis not present

## 2023-12-05 DIAGNOSIS — R7989 Other specified abnormal findings of blood chemistry: Secondary | ICD-10-CM | POA: Diagnosis not present

## 2023-12-05 LAB — POCT URINALYSIS DIP (CLINITEK)
Bilirubin, UA: NEGATIVE
Blood, UA: NEGATIVE
Glucose, UA: NEGATIVE mg/dL
Ketones, POC UA: NEGATIVE mg/dL
Leukocytes, UA: NEGATIVE
Nitrite, UA: NEGATIVE
POC PROTEIN,UA: NEGATIVE
Spec Grav, UA: 1.02 (ref 1.010–1.025)
Urobilinogen, UA: 0.2 U/dL
pH, UA: 6 (ref 5.0–8.0)

## 2023-12-05 NOTE — Progress Notes (Signed)
 Subjective:  Patient ID: Jacob Stephens, male    DOB: [birth year]  Age: 53 y.o. MRN: [id number]  Chief Complaint  Patient presents with   Annual Exam    HPI Well Adult Physical: Patient here for a comprehensive physical exam.The patient reports no problems Do you take any herbs or supplements that were not prescribed by a doctor? no Are you taking calcium  supplements? no Are you taking aspirin daily? no  Encounter for general adult medical examination without abnormal findings  Physical (At Risk items are starred): Patient's last physical exam was 1 year ago .  Patient is not afflicted from Stress Incontinence and Urge Incontinence  Patient wears a seat belt, has smoke detectors, has carbon monoxide detectors, practices appropriate gun safety, and wears sunscreen with extended sun exposure. Dental Care: brushes and flosses daily. Last dental visit: up to date Vision impairments: none Ophthalmology/Optometry: is overdue Hearing loss: none Last PSA:  Lab Results  Component Value Date   PSA1 0.6 08/21/2023   PSA1 0.6 06/22/2021   Self Testicular Exam: Yes     12/05/2023    4:01 PM 08/20/2023    3:05 PM 03/29/2022    3:24 PM 06/22/2021    3:37 PM 02/24/2020    8:58 AM  Depression screen PHQ 2/9  Decreased Interest 0 0 0 0 0  Down, Depressed, Hopeless 0 0  0 0  PHQ - 2 Score 0 0 0 0 0         03/19/2020   12:04 PM 12/05/2023    4:01 PM  Fall Risk  Falls in the past year? 0 0  Was there an injury with Fall? 0 0  Fall Risk Category Calculator 0 0  Fall Risk Category (Retired) Low    (RETIRED) Patient Fall Risk Level Low fall risk    Patient at Risk for Falls Due to  No Fall Risks  Fall risk Follow up Falls evaluation completed  Falls evaluation completed     Data saved with a previous flowsheet row definition             Social Hx   Social History   Socioeconomic History   Marital status: Married    Spouse name: Not on file   Number of children: 2   Years of  education: Not on file   Highest education level: Not on file  Occupational History   Occupation: Truck Hospital doctor  Tobacco Use   Smoking status: Never   Smokeless tobacco: Never  Substance and Sexual Activity   Alcohol use: Never   Drug use: Never   Sexual activity: Not Currently  Other Topics Concern   Not on file  Social History Narrative   Not on file   Social Drivers of Health   Financial Resource Strain: Low Risk  (08/20/2023)   Overall Financial Resource Strain (CARDIA)    Difficulty of Paying Living Expenses: Not hard at all  Food Insecurity: No Food Insecurity (08/20/2023)   Hunger Vital Sign    Worried About Running Out of Food in the Last Year: Never true    Ran Out of Food in the Last Year: Never true  Transportation Needs: No Transportation Needs (08/20/2023)   PRAPARE - Administrator, Civil Service (Medical): No    Lack of Transportation (Non-Medical): No  Physical Activity: Not on file  Stress: No Stress Concern Present (08/20/2023)   [employer] of Occupational Health - Occupational Stress Questionnaire    Feeling of Stress :  Not at all  Social Connections: Not on file   Past Medical History:  Diagnosis Date   Closed displaced fracture of shaft of left clavicle 01/14/2018   Formatting of this note might be different from the original. Added automatically from request for surgery [id number]   Epididymitis, right 02/05/2020   GERD (gastroesophageal reflux disease)    Low back pain    Past Surgical History:  Procedure Laterality Date   S/P surgical exploration of scrotum Right 02/05/2020    Family History  Problem Relation Age of Onset   Diverticulitis Mother    Thyroid  disease Mother    Prostate cancer Father    Heart attack Father     ROS CONSTITUTIONAL: Negative for chills, fatigue, fever, unintentional weight gain and unintentional weight loss.  E/N/T: Negative for ear pain, nasal congestion and sore throat.  CARDIOVASCULAR: Negative  for chest pain, dizziness, palpitations and pedal edema.  RESPIRATORY: Negative for recent cough and dyspnea.  GASTROINTESTINAL: Negative for abdominal pain, acid reflux symptoms, constipation, diarrhea, nausea and vomiting.  MSK: Negative for arthralgias and myalgias.  INTEGUMENTARY: Negative for rash.  NEUROLOGICAL: Negative for dizziness and headaches.  PSYCHIATRIC: Negative for sleep disturbance and to question depression screen.  Negative for depression, negative for anhedonia.   Objective:  PHYSICAL EXAM:   BP (!) 142/72   Pulse 80   Temp (!) 97.5 F (36.4 C)   Ht 6' 1 (1.854 m)   Wt 272 lb 3.2 oz (123.5 kg)   SpO2 98%   BMI 35.91 kg/m  BP Readings from Last 3 Encounters:  12/05/23 (!) 142/72  08/20/23 122/70  03/29/22 116/74     Vision Screening   Right eye Left eye Both eyes  Without correction 20/20 20/20 20/20   With correction        GEN: Well nourished, well developed, in no acute distress  HEENT: normal external ears and nose - normal external auditory canals and TMS - hearing grossly normal  - Lips, Teeth and Gums - normal  Oropharynx - normal mucosa, palate, and posterior pharynx Neck: no JVD or masses - no thyromegaly Cardiac: RRR; no murmurs, rubs, or gallops,no edema -  Respiratory:  normal respiratory rate and pattern with no distress - normal breath sounds with no rales, rhonchi, wheezes or rubs GI: normal bowel sounds, no masses or tenderness MS: no deformity or atrophy  Skin: warm and dry, no rash  Neuro:  Alert and Oriented x 3, Strength and sensation are intact - CN II-Xii grossly intact Psych: euthymic mood, appropriate affect and demeanor Office Visit on 12/05/2023  Component Date Value Ref Range Status   Color, UA 12/05/2023 yellow  yellow Final   Clarity, UA 12/05/2023 clear  clear Final   Glucose, UA 12/05/2023 negative  negative mg/dL Final   Bilirubin, UA 91/93/7974 negative  negative Final   Ketones, POC UA 12/05/2023 negative   negative mg/dL Final   Spec Grav, UA 91/93/7974 1.020  1.010 - 1.025 Final   Blood, UA 12/05/2023 negative  negative Final   pH, UA 12/05/2023 6.0  5.0 - 8.0 Final   POC PROTEIN,UA 12/05/2023 negative  negative, trace Final   Urobilinogen, UA 12/05/2023 0.2  0.2 or 1.0 E.U./dL Final   Nitrite, UA 91/93/7974 Negative  Negative Final   Leukocytes, UA 12/05/2023 Negative  Negative Final    Assessment & Plan:  Annual physical exam -     POCT URINALYSIS DIP (CLINITEK) -     Comprehensive metabolic panel with GFR;  Future -     Lipid panel; Future -     CBC with Differential/Platelet; Future -     TSH; Future -     Testosterone ,Free and Total; Future  Low testosterone  -     Testosterone ,Free and Total; Future    This is a list of the screening recommended for you and due dates:  Health Maintenance  Topic Date Due   COVID-19 Vaccine (4 - 2024-25 season) 12/21/2023*   Zoster (Shingles) Vaccine (1 of 2) 03/06/2024*   Flu Shot  07/29/2024*   Cologuard (Stool DNA test)  08/19/2024*   Pneumococcal Vaccine for age over 55 (1 of 1 - PCV) 12/04/2024*   Hepatitis B Vaccine (1 of 3 - 19+ 3-dose series) 12/04/2024*   DTaP/Tdap/Td vaccine (2 - Td or Tdap) 06/23/2031   HPV Vaccine  Aged Out   Meningitis B Vaccine  Aged Out   Hepatitis C Screening  Discontinued   HIV Screening  Discontinued  *Topic was postponed. The date shown is not the original due date.     Follow-up: Return in about 6 months (around 06/06/2024) for chronic fasting follow-up.  An After Visit Summary was printed and given to the patient.  CAMIE JONELLE NICHOLAUS DEVONNA [facility] [phone]

## 2023-12-06 ENCOUNTER — Other Ambulatory Visit

## 2023-12-06 DIAGNOSIS — Z0000 Encounter for general adult medical examination without abnormal findings: Secondary | ICD-10-CM

## 2023-12-06 DIAGNOSIS — R7989 Other specified abnormal findings of blood chemistry: Secondary | ICD-10-CM

## 2023-12-11 ENCOUNTER — Ambulatory Visit: Payer: Self-pay | Admitting: Physician Assistant

## 2023-12-11 LAB — CBC WITH DIFFERENTIAL/PLATELET
Basophils Absolute: 0 x10E3/uL (ref 0.0–0.2)
Basos: 1 %
EOS (ABSOLUTE): 0.2 x10E3/uL (ref 0.0–0.4)
Eos: 4 %
Hematocrit: 42.4 % (ref 37.5–51.0)
Hemoglobin: 13.6 g/dL (ref 13.0–17.7)
Immature Grans (Abs): 0 x10E3/uL (ref 0.0–0.1)
Immature Granulocytes: 0 %
Lymphocytes Absolute: 2.2 x10E3/uL (ref 0.7–3.1)
Lymphs: 34 %
MCH: 28.9 pg (ref 26.6–33.0)
MCHC: 32.1 g/dL (ref 31.5–35.7)
MCV: 90 fL (ref 79–97)
Monocytes Absolute: 0.4 x10E3/uL (ref 0.1–0.9)
Monocytes: 7 %
Neutrophils Absolute: 3.4 x10E3/uL (ref 1.4–7.0)
Neutrophils: 54 %
Platelets: 288 x10E3/uL (ref 150–450)
RBC: 4.7 x10E6/uL (ref 4.14–5.80)
RDW: 12.9 % (ref 11.6–15.4)
WBC: 6.3 x10E3/uL (ref 3.4–10.8)

## 2023-12-11 LAB — COMPREHENSIVE METABOLIC PANEL WITH GFR
ALT: 41 IU/L (ref 0–44)
AST: 24 IU/L (ref 0–40)
Albumin: 4.4 g/dL (ref 3.8–4.9)
Alkaline Phosphatase: 65 IU/L (ref 44–121)
BUN/Creatinine Ratio: 16 (ref 9–20)
BUN: 15 mg/dL (ref 6–24)
Bilirubin Total: 0.3 mg/dL (ref 0.0–1.2)
CO2: 21 mmol/L (ref 20–29)
Calcium: 9.9 mg/dL (ref 8.7–10.2)
Chloride: 104 mmol/L (ref 96–106)
Creatinine, Ser: 0.91 mg/dL (ref 0.76–1.27)
Globulin, Total: 2.8 g/dL (ref 1.5–4.5)
Glucose: 83 mg/dL (ref 70–99)
Potassium: 4.7 mmol/L (ref 3.5–5.2)
Sodium: 142 mmol/L (ref 134–144)
Total Protein: 7.2 g/dL (ref 6.0–8.5)
eGFR: 101 mL/min/1.73 (ref >59)

## 2023-12-11 LAB — TSH: TSH: 3.35 u[IU]/mL (ref 0.450–4.500)

## 2023-12-11 LAB — LIPID PANEL
Chol/HDL Ratio: 4.3 ratio (ref 0.0–5.0)
Cholesterol, Total: 179 mg/dL (ref 100–199)
HDL: 42 mg/dL (ref >39)
LDL Chol Calc (NIH): 111 mg/dL — ABNORMAL HIGH (ref 0–99)
Triglycerides: 147 mg/dL (ref 0–149)
VLDL Cholesterol Cal: 26 mg/dL (ref 5–40)

## 2023-12-11 LAB — TESTOSTERONE,FREE AND TOTAL
Testosterone, Free: 2.3 pg/mL — ABNORMAL LOW (ref 7.2–24.0)
Testosterone: 351 ng/dL (ref 264–916)

## 2023-12-22 ENCOUNTER — Other Ambulatory Visit: Payer: Self-pay | Admitting: Physician Assistant

## 2023-12-22 DIAGNOSIS — K219 Gastro-esophageal reflux disease without esophagitis: Secondary | ICD-10-CM

## 2023-12-25 ENCOUNTER — Other Ambulatory Visit: Payer: Self-pay | Admitting: Physician Assistant

## 2023-12-25 DIAGNOSIS — K219 Gastro-esophageal reflux disease without esophagitis: Secondary | ICD-10-CM

## 2023-12-25 MED ORDER — PANTOPRAZOLE SODIUM 40 MG PO TBEC
40.0000 mg | DELAYED_RELEASE_TABLET | Freq: Every day | ORAL | 1 refills | Status: AC
Start: 2023-12-25 — End: ?

## 2024-02-22 ENCOUNTER — Other Ambulatory Visit: Payer: Self-pay | Admitting: Physician Assistant

## 2024-02-22 DIAGNOSIS — E782 Mixed hyperlipidemia: Secondary | ICD-10-CM

## 2024-02-28 ENCOUNTER — Other Ambulatory Visit: Payer: Self-pay | Admitting: Physician Assistant

## 2024-02-28 DIAGNOSIS — E782 Mixed hyperlipidemia: Secondary | ICD-10-CM

## 2024-05-29 ENCOUNTER — Other Ambulatory Visit: Payer: Self-pay | Admitting: Physician Assistant

## 2024-05-29 DIAGNOSIS — E782 Mixed hyperlipidemia: Secondary | ICD-10-CM
